# Patient Record
Sex: Female | Born: 1955 | Race: White | Hispanic: No | Smoking: Never smoker
Health system: Southern US, Community
[De-identification: ages and names within clinical notes are randomized; demographics above are authoritative.]

## PROBLEM LIST (undated history)

## (undated) DIAGNOSIS — F32A Depression, unspecified: Secondary | ICD-10-CM

## (undated) DIAGNOSIS — M1990 Unspecified osteoarthritis, unspecified site: Secondary | ICD-10-CM

## (undated) DIAGNOSIS — E119 Type 2 diabetes mellitus without complications: Secondary | ICD-10-CM

## (undated) DIAGNOSIS — T8859XA Other complications of anesthesia, initial encounter: Secondary | ICD-10-CM

## (undated) DIAGNOSIS — T4145XA Adverse effect of unspecified anesthetic, initial encounter: Secondary | ICD-10-CM

## (undated) DIAGNOSIS — R112 Nausea with vomiting, unspecified: Secondary | ICD-10-CM

## (undated) DIAGNOSIS — F419 Anxiety disorder, unspecified: Secondary | ICD-10-CM

## (undated) DIAGNOSIS — F329 Major depressive disorder, single episode, unspecified: Secondary | ICD-10-CM

## (undated) DIAGNOSIS — E039 Hypothyroidism, unspecified: Secondary | ICD-10-CM

## (undated) DIAGNOSIS — I10 Essential (primary) hypertension: Secondary | ICD-10-CM

## (undated) HISTORY — PX: EYE SURGERY: SHX253

## (undated) HISTORY — PX: OTHER SURGICAL HISTORY: SHX169

---
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
## 2003-08-02 ENCOUNTER — Other Ambulatory Visit: Admission: RE | Admit: 2003-08-02 | Discharge: 2003-08-02 | Payer: Self-pay | Admitting: Obstetrics and Gynecology

## 2004-09-26 ENCOUNTER — Ambulatory Visit (HOSPITAL_COMMUNITY): Admission: RE | Admit: 2004-09-26 | Discharge: 2004-09-26 | Payer: Self-pay | Admitting: Orthopedic Surgery

## 2005-07-04 ENCOUNTER — Other Ambulatory Visit: Admission: RE | Admit: 2005-07-04 | Discharge: 2005-07-04 | Payer: Self-pay | Admitting: Obstetrics and Gynecology

## 2006-05-23 ENCOUNTER — Ambulatory Visit (HOSPITAL_COMMUNITY): Admission: RE | Admit: 2006-05-23 | Discharge: 2006-05-24 | Payer: Self-pay | Admitting: Orthopedic Surgery

## 2006-11-05 ENCOUNTER — Encounter: Admission: RE | Admit: 2006-11-05 | Discharge: 2006-11-05 | Payer: Self-pay | Admitting: Surgery

## 2006-11-13 ENCOUNTER — Ambulatory Visit (HOSPITAL_COMMUNITY): Admission: RE | Admit: 2006-11-13 | Discharge: 2006-11-13 | Payer: Self-pay | Admitting: Surgery

## 2006-11-14 ENCOUNTER — Ambulatory Visit (HOSPITAL_COMMUNITY): Admission: RE | Admit: 2006-11-14 | Discharge: 2006-11-14 | Payer: Self-pay | Admitting: Surgery

## 2007-01-08 ENCOUNTER — Ambulatory Visit (HOSPITAL_COMMUNITY): Admission: RE | Admit: 2007-01-08 | Discharge: 2007-01-08 | Payer: Self-pay | Admitting: Surgery

## 2007-05-22 HISTORY — PX: LAPAROSCOPIC GASTRIC BANDING: SHX1100

## 2007-06-11 ENCOUNTER — Encounter: Admission: RE | Admit: 2007-06-11 | Discharge: 2007-09-09 | Payer: Self-pay | Admitting: Surgery

## 2007-06-30 ENCOUNTER — Inpatient Hospital Stay (HOSPITAL_COMMUNITY): Admission: RE | Admit: 2007-06-30 | Discharge: 2007-07-01 | Payer: Self-pay | Admitting: Surgery

## 2007-08-28 ENCOUNTER — Encounter: Admission: RE | Admit: 2007-08-28 | Discharge: 2007-08-28 | Payer: Self-pay | Admitting: Surgery

## 2007-11-27 ENCOUNTER — Encounter: Admission: RE | Admit: 2007-11-27 | Discharge: 2007-11-27 | Payer: Self-pay | Admitting: Surgery

## 2009-04-26 ENCOUNTER — Encounter: Admission: RE | Admit: 2009-04-26 | Discharge: 2009-05-18 | Payer: Self-pay | Admitting: Surgery

## 2010-10-03 NOTE — Op Note (Signed)
Ann Myers, Ann Myers                 ACCOUNT NO.:  [id number]   MEDICAL RECORD NO.:  [id number]          PATIENT TYPE:  INP   LOCATION:  1240                         FACILITY:  [facility]   PHYSICIAN:  Thornton Park. Daphine Deutscher, MD  DATE OF BIRTH:  06/21/1955   DATE OF PROCEDURE:  06/30/2007  DATE OF DISCHARGE:                               OPERATIVE REPORT   CCS#:  [id number].   PREOPERATIVE DIAGNOSES:  1. Morbid obesity body mass index 49.  2. Diabetes.  3. Hypertension.   POSTOPERATIVE DIAGNOSES:  Not given.   PROCEDURE:  Laparoscopic lap band (Allergan) APL system.   SURGEON:  Dr. Luretha Murphy.   ASSISTANT:  Dr. Ovidio Kin.   ANESTHESIA:  General endotracheal.   COMPLICATIONS:  Left upper quadrant trocar  placement went into the  abdomen and across the diaphragm into chest producing temporary left  pneumothorax which was treated with the Veress needle.   DESCRIPTION OF PROCEDURE:  Ann Myers is taken to room #1 on Monday,  June 30, 2007 and given general anesthesia.  The abdomen is was  prepped with Techni-Care and draped sterilely.  After a time-out down  and following her latex allergy protocol, I went below the left costal  region and made above and centimeter and a half incision for placement  of the OptiVu trocar.  With a zero degree trocar in place, I first put  my finger in and dissected down through the fat to the fascia and it was  well below the rib.  At that point, I went ahead and introduced the  OptiVu and watched to go through to the muscle layers and then into what  appeared to be the peritoneum and at that point I was not getting any  insufflation into the abdomen that  I could tell.  I moved around, kind  of gently went in gently went in and felt that I was sort of on the cusp  and could see of muscle layers and felt like I was in but was not able  to blow away the fat to get my camera into the pneumoperitoneum.  Percussion of the abdomen revealed there to be a  pneumoperitoneum and on  pulling back and trying to go forward I felt like there was an area to  the right and I looked in there and I could see up into the chest and  there was likely a pneumothorax from insufflation in that region as  well.  At that point, with the pneumoperitoneum I went ahead and entered  the abdomen through the right upper quadrant and could see where the  trocar would kind of come in and then become angulated as it went  beneath the rib.  The patient was hemodynamically stable but  subsequently was noted to have decreased breath sounds.  I then went  into a prepped area prepped with Techni-Care in the intercostal space  beneath the left breast and using a laparoscope into the abdomen where I  could watch myself, I inserted a Veress needle and once it popped in, I  could hear  air escaping with ventilation and I elected to go ahead and  leave that in place and put it to suction throughout the case.   The patient again remained hemodynamically stable and with that in place  I went ahead and placed the remainder of my trocars including the 5 mm  upper midline, the 10 across below and retracted the liver.  I dissected  on the left side for the band site and then went over on the right side  and found the right crus and dissected the spot for the band passer.  I  had a 15 in the upper position and the 11 below and introduced the band  after passing the passer from the inferior port up which it came out  easily on the other side.  The band was then threaded through the band  passer, pulled around, it was snapped in place.  Prior to snapping it,  we passed the calibration tube and there was plenty of room.  I chose an  APL band because of the fatty mass along her lesser curvature.  It was  then pulled down by Dr. Ezzard Standing while I plicated it with three sutures  using free needles and tie knots.  Good band position was felt to be in  place, it was nice and secure. There is no  bleeding.  There were no  other areas of question in the abdomen. We went ahead and observed the  left upper quadrant and  I could see where the diaphragm had fluttered  when I had initially sucked all the air out and we see where it was and  it appeared to be still suctioned. We then deflated the abdomen  completely and then I with the suction withdrew the Veress needle.   The wounds were all closed with 4-0 Vicryl, Benzoin and Steri-Strips. A  chest x-ray was taken at the end of the case and there was no evidence  of pneumothorax. The patient tolerated the procedure well and was taken  to the recovery room. She will be monitored in step down and followup  chest x-rays obtained.      Thornton Park Daphine Deutscher, MD  Electronically Signed     MBM/MEDQ  D:  06/30/2007  T:  07/01/2007  Job:  [id number]   cc:   Minna Antis, MD  [place], [place]

## 2011-02-09 LAB — CBC
HCT: 36.5
Hemoglobin: 12.7
MCHC: 34.8
MCV: 88
Platelets: 351
RBC: 4.15
WBC: 10.7 — ABNORMAL HIGH

## 2011-02-09 LAB — BASIC METABOLIC PANEL
BUN: 20
Calcium: 9.3
Chloride: 97
Creatinine, Ser: 0.79
GFR calc Af Amer: >60
GFR calc non Af Amer: >60

## 2011-02-09 LAB — URINALYSIS, ROUTINE W REFLEX MICROSCOPIC
Glucose, UA: NEGATIVE
Leukocytes, UA: NEGATIVE
Specific Gravity, Urine: 1.03
Urobilinogen, UA: 0.2

## 2011-02-09 LAB — DIFFERENTIAL
Eosinophils Relative: 0
Monocytes Relative: 8
Neutro Abs: 8.3 — ABNORMAL HIGH
Neutrophils Relative %: 78 — ABNORMAL HIGH

## 2011-02-09 LAB — URINE MICROSCOPIC-ADD ON

## 2011-02-09 LAB — PREGNANCY, URINE: Preg Test, Ur: NEGATIVE

## 2012-05-21 HISTORY — PX: HERNIA REPAIR: SHX51

## 2013-05-21 HISTORY — PX: CHOLECYSTECTOMY: SHX55

## 2018-05-21 HISTORY — PX: DILATION AND CURETTAGE OF UTERUS: SHX78

## 2019-01-20 HISTORY — PX: OTHER SURGICAL HISTORY: SHX169

## 2019-07-13 NOTE — H&P (Signed)
TOTAL HIP ADMISSION H&P  Patient is admitted for right total hip arthroplasty, anterior approach.  Subjective:  Chief Complaint:   Right hip primary OA / pain  HPI: Ann Myers, 64 y.o. female, has a history of pain and functional disability in the right hip(s) due to arthritis and patient has failed non-surgical conservative treatments for greater than 12 weeks to include NSAID's and/or analgesics, use of assistive devices and activity modification.  Onset of symptoms was gradual starting <1 year ago with rapidlly worsening course since that time.The patient noted no past surgery on the right hip(s).  Patient currently rates pain in the right hip at 10 out of 10 with activity. Patient has worsening of pain with activity and weight bearing, trendelenberg gait, pain that interfers with activities of daily living and pain with passive range of motion. Patient has evidence of periarticular osteophytes and joint space narrowing by imaging studies. This condition presents safety issues increasing the risk of falls.  There is no current active infection.  Risks, benefits and expectations were discussed with the patient.  Risks including but not limited to the risk of anesthesia, blood clots, nerve damage, blood vessel damage, failure of the prosthesis, infection and up to and including death.  Patient understand the risks, benefits and expectations and wishes to proceed with surgery.   PCP: Renaldo Reel, DO  D/C Plans:       Home (same day)  Post-op Meds:       No Rx given   Tranexamic Acid:      To be given - IV   Decadron:      Is to be given  FYI:     ASA  Oxycodone (used previously)  DME:   Pt already has equipment   PT:   HEP  Pharmacy: [facility]     Past Medical History:  Diagnosis Date  . Anxiety   . Arthritis   . Complication of anesthesia   . Depression   . Diabetes mellitus without complication ([place])   . Hypertension   . Hypothyroidism   . PONV  (postoperative nausea and vomiting)     Past Surgical History:  Procedure Laterality Date  . CESAREAN SECTION  1988  . CHOLECYSTECTOMY  2015  . DILATION AND CURETTAGE OF UTERUS  2020   Dr. Ouida Sills at [facility]  and at 64 years old-for irregular bleeding  . EYE SURGERY     brow lift surgery  . ganglion cyst of foot  01/2019   left foot  . HERNIA REPAIR  2014   incisional hernia repair-and did tummy tuck at that time  . LAPAROSCOPIC GASTRIC BANDING  2009  . left shoulder surgery     shaving of left shoulder-arthritis    No current facility-administered medications for this encounter.   Current Outpatient Medications  Medication Sig Dispense Refill Last Dose  . acetaminophen (TYLENOL) 500 MG tablet Take 1,000 mg by mouth every 6 (six) hours as needed for mild pain or moderate pain.     . APPLE CIDER VINEGAR PO Take 1,000 mg by mouth daily.     Marland Kitchen dicyclomine (BENTYL) 10 MG capsule Take 10 mg by mouth 3 (three) times daily as needed for diarrhea or loose stools.     . DULoxetine (CYMBALTA) 60 MG capsule Take 60 mg by mouth daily.     Marland Kitchen ibuprofen (ADVIL) 200 MG tablet Take 400 mg by mouth every 6 (six) hours as needed for mild pain or  moderate pain.     . INVOKANA 100 MG TABS tablet Take 100 mg by mouth daily.     Marland Kitchen levothyroxine (SYNTHROID) 88 MCG tablet Take 88 mcg by mouth daily.     Marland Kitchen losartan-hydrochlorothiazide (HYZAAR) 50-12.5 MG tablet Take 1 tablet by mouth daily.     . Multiple Vitamin (MULTI-VITAMIN) tablet Take 1 tablet by mouth daily. Woman 50+     . OZEMPIC, 0.25 OR 0.5 MG/DOSE, 2 MG/1.5ML SOPN Inject 0.5 mg into the skin once a week. Friday     . Polyethyl Glycol-Propyl Glycol (SYSTANE FREE OP) Place 1 drop into both eyes daily as needed (Dry eye).     Marland Kitchen tolterodine (DETROL LA) 4 MG 24 hr capsule Take 4 mg by mouth daily.     . Triamcinolone Acetonide (NASACORT AQ NA) Place 1 spray into the nose daily as needed (congestion).      Allergies  Allergen Reactions  .  Benactyzine Hives  . Other Hives  . Povidone Iodine Hives  . Short Ragweed Pollen Ext Hives  . Adhesive [Tape] Other (See Comments)    Steri-strips/Welp  . Latex Other (See Comments)    Dry Blister     Social History   Tobacco Use  . Smoking status: Never Smoker  . Smokeless tobacco: Never Used  Substance Use Topics  . Alcohol use: Yes    Comment: occassionally       Review of Systems  Constitutional: Negative.   HENT: Negative.   Eyes: Negative.   Respiratory: Negative.   Cardiovascular: Negative.   Gastrointestinal: Negative.   Genitourinary: Negative.   Musculoskeletal: Positive for joint pain.  Skin: Negative.   Neurological: Positive for headaches.  Endo/Heme/Allergies: Negative.   Psychiatric/Behavioral: Positive for depression. The patient is nervous/anxious.      Objective:  Physical Exam  Constitutional: She is oriented to person, place, and time. She appears well-developed.  HENT:  Head: Normocephalic.  Eyes: Pupils are equal, round, and reactive to light.  Neck: No JVD present. No tracheal deviation present. No thyromegaly present.  Cardiovascular: Normal rate, regular rhythm and intact distal pulses.  Respiratory: Effort normal and breath sounds normal. No respiratory distress. She has no wheezes.  GI: Soft. There is no abdominal tenderness. There is no guarding.  Musculoskeletal:     Cervical back: Neck supple.     Right hip: Tenderness and bony tenderness present. No swelling, deformity or lacerations. Decreased range of motion. Decreased strength.  Lymphadenopathy:    She has no cervical adenopathy.  Neurological: She is alert and oriented to person, place, and time.  Skin: Skin is warm and dry.  Psychiatric: She has a normal mood and affect.      Imaging Review Plain radiographs demonstrate severe degenerative joint disease of the right hip. The bone quality appears to be good for age and reported activity  level.      Assessment/Plan:  End stage arthritis, right hip  The patient history, physical examination, clinical judgement of the provider and imaging studies are consistent with end stage degenerative joint disease of the right hip and total hip arthroplasty is deemed medically necessary. The treatment options including medical management, injection therapy, arthroscopy and arthroplasty were discussed at length. The risks and benefits of total hip arthroplasty were presented and reviewed. The risks due to aseptic loosening, infection, stiffness, dislocation/subluxation,  thromboembolic complications and other imponderables were discussed.  The patient acknowledged the explanation, agreed to proceed with the plan and consent was signed. Patient is  being admitted for inpatient treatment for surgery, pain control, PT, OT, prophylactic antibiotics, VTE prophylaxis, progressive ambulation and ADL's and discharge planning.The patient is planning to be discharged home.      Anastasio Auerbach Jilliann Subramanian   PA-C  07/27/2019, 9:13 PM

## 2019-07-20 NOTE — Patient Instructions (Addendum)
DUE TO COVID-19 ONLY ONE VISITOR IS ALLOWED TO COME WITH YOU AND STAY IN THE WAITING ROOM ONLY DURING PRE OP AND PROCEDURE DAY OF SURGERY. THE 1 VISITOR MAY VISIT WITH YOU AFTER SURGERY IN YOUR PRIVATE ROOM DURING VISITING HOURS ONLY!  YOU NEED TO HAVE A COVID 19 TEST ON__Friday 03/05/2021_____ @__10 :45 am_____, THIS TEST MUST BE DONE BEFORE SURGERY, COME  [address], [place] , .  [facility]) ONCE YOUR COVID TEST IS COMPLETED, PLEASE BEGIN THE QUARANTINE INSTRUCTIONS AS OUTLINED IN YOUR HANDOUT.                ROSINA CRESSLER     Your procedure is scheduled on: Tuesday 07/28/2019   Report to [facility] Main  Entrance    Report to admitting at  0900  AM     Call this number if you have problems the morning of surgery [phone]        How to Manage Your Diabetes  Before and After Surgery  Why is it important to control my blood sugar before and after surgery? . Improving blood sugar levels before and after surgery helps healing and can limit problems. . A way of improving blood sugar control is eating a healthy diet by: o  Eating less sugar and carbohydrates o  Increasing activity/exercise o  Talking with your doctor about reaching your blood sugar goals . High blood sugars (greater than 180 mg/dL) can raise your risk of infections and slow your recovery, so you will need to focus on controlling your diabetes during the weeks before surgery. . Make sure that the doctor who takes care of your diabetes knows about your planned surgery including the date and location.  How do I manage my blood sugar before surgery? . Check your blood sugar at least 4 times a day, starting 2 days before surgery, to make sure that the level is not too high or low. o Check your blood sugar the morning of your surgery when you wake up and every 2 hours until you get to the Short Stay unit. . If your blood sugar is less than 70 mg/dL, you will need to treat for low  blood sugar: o Do not take insulin. o Treat a low blood sugar (less than 70 mg/dL) with  cup of clear juice (cranberry or apple), 4 glucose tablets, OR glucose gel. o Recheck blood sugar in 15 minutes after treatment (to make sure it is greater than 70 mg/dL). If your blood sugar is not greater than 70 mg/dL on recheck, call [phone] for further instructions. . Report your blood sugar to the short stay nurse when you get to Short Stay.  . If you are admitted to the hospital after surgery: o Your blood sugar will be checked by the staff and you will probably be given insulin after surgery (instead of oral diabetes medicines) to make sure you have good blood sugar levels. o The goal for blood sugar control after surgery is 80-180 mg/dL.   WHAT DO I DO ABOUT MY DIABETES MEDICATION?        The day before surgery, DO NOT TAKE INVOKANA!  [phone] Do not take oral diabetes medicines (pills) the morning of surgery.     Remember: Do not eat food  :After Midnight.    NO SOLID FOOD AFTER MIDNIGHT THE NIGHT PRIOR TO SURGERY. NOTHING BY MOUTH EXCEPT CLEAR LIQUIDS UNTIL  0830 am .    PLEASE FINISH  Gatorade  G 2  DRINK PER SURGEON ORDER  WHICH NEEDS TO BE COMPLETED AT  0830 am.   CLEAR LIQUID DIET   Foods Allowed                                                                     Foods Excluded  Coffee and tea, regular and decaf                             liquids that you cannot  Plain Jell-O any favor except red or purple                                           see through such as: Fruit ices (not with fruit pulp)                                     milk, soups, orange juice  Iced Popsicles                                    All solid food Carbonated beverages, regular and diet                                    Cranberry, grape and apple juices Sports drinks like Gatorade Lightly seasoned clear broth or consume(fat free) Sugar, honey syrup  Sample Menu Breakfast                                 Lunch                                     Supper Cranberry juice                    Beef broth                            Chicken broth Jell-O                                     Grape juice                           Apple juice Coffee or tea                        Jell-O                                      Popsicle  Coffee or tea                        Coffee or tea  _____________________________________________________________________     BRUSH YOUR TEETH MORNING OF SURGERY AND RINSE YOUR MOUTH OUT, NO CHEWING GUM CANDY OR MINTS.     Take these medicines the morning of surgery with A SIP OF WATER: Duloxetine (Cymbalta), Levothyroxine (Synthroid)   DO NOT TAKE ANY DIABETIC MEDICATIONS DAY OF YOUR SURGERY!                                You may not have any metal on your body including hair pins and              piercings  Do not wear jewelry, make-up, lotions, powders or perfumes, deodorant             Do not wear nail polish on your fingernails.  Do not shave  48 hours prior to surgery.                 Do not bring valuables to the hospital. [facility] IS NOT             RESPONSIBLE   FOR VALUABLES.  Contacts, dentures or bridgework may not be worn into surgery.  Leave suitcase in the car. After surgery it may be brought to your room.                   Please read over the following fact sheets you were given: _____________________________________________________________________             [facility] - Preparing for Surgery Before surgery, you can play an important role.  Because skin is not sterile, your skin needs to be as free of germs as possible.  You can reduce the number of germs on your skin by washing with CHG (chlorahexidine gluconate) soap before surgery.  CHG is an antiseptic cleaner which kills germs and bonds with the skin to continue killing germs even after washing. Please DO NOT use if you have an allergy  to CHG or antibacterial soaps.  If your skin becomes reddened/irritated stop using the CHG and inform your nurse when you arrive at Short Stay. Do not shave (including legs and underarms) for at least 48 hours prior to the first CHG shower.  You may shave your face/neck. Please follow these instructions carefully:  1.  Shower with CHG Soap the night before surgery and the  morning of Surgery.  2.  If you choose to wash your hair, wash your hair first as usual with your  normal  shampoo.  3.  After you shampoo, rinse your hair and body thoroughly to remove the  shampoo.                           4.  Use CHG as you would any other liquid soap.  You can apply chg directly  to the skin and wash                       Gently with a scrungie or clean washcloth.  5.  Apply the CHG Soap to your body ONLY FROM THE NECK DOWN.   Do not use on face/ open  Wound or open sores. Avoid contact with eyes, ears mouth and genitals (private parts).                       Wash face,  Genitals (private parts) with your normal soap.             6.  Wash thoroughly, paying special attention to the area where your surgery  will be performed.  7.  Thoroughly rinse your body with warm water from the neck down.  8.  DO NOT shower/wash with your normal soap after using and rinsing off  the CHG Soap.                9.  Pat yourself dry with a clean towel.            10.  Wear clean pajamas.            11.  Place clean sheets on your bed the night of your first shower and do not  sleep with pets. Day of Surgery : Do not apply any lotions/deodorants the morning of surgery.  Please wear clean clothes to the hospital/surgery center.  FAILURE TO FOLLOW THESE INSTRUCTIONS MAY RESULT IN THE CANCELLATION OF YOUR SURGERY PATIENT SIGNATURE_________________________________  NURSE SIGNATURE__________________________________  ________________________________________________________________________   Adam Phenix  An incentive spirometer is a tool that can help keep your lungs clear and active. This tool measures how well you are filling your lungs with each breath. Taking long deep breaths may help reverse or decrease the chance of developing breathing (pulmonary) problems (especially infection) following:  A long period of time when you are unable to move or be active. BEFORE THE PROCEDURE   If the spirometer includes an indicator to show your best effort, your nurse or respiratory therapist will set it to a desired goal.  If possible, sit up straight or lean slightly forward. Try not to slouch.  Hold the incentive spirometer in an upright position. INSTRUCTIONS FOR USE  1. Sit on the edge of your bed if possible, or sit up as far as you can in bed or on a chair. 2. Hold the incentive spirometer in an upright position. 3. Breathe out normally. 4. Place the mouthpiece in your mouth and seal your lips tightly around it. 5. Breathe in slowly and as deeply as possible, raising the piston or the ball toward the top of the column. 6. Hold your breath for 3-5 seconds or for as long as possible. Allow the piston or ball to fall to the bottom of the column. 7. Remove the mouthpiece from your mouth and breathe out normally. 8. Rest for a few seconds and repeat Steps 1 through 7 at least 10 times every 1-2 hours when you are awake. Take your time and take a few normal breaths between deep breaths. 9. The spirometer may include an indicator to show your best effort. Use the indicator as a goal to work toward during each repetition. 10. After each set of 10 deep breaths, practice coughing to be sure your lungs are clear. If you have an incision (the cut made at the time of surgery), support your incision when coughing by placing a pillow or rolled up towels firmly against it. Once you are able to get out of bed, walk around indoors and cough well. You may stop using the incentive spirometer when  instructed by your caregiver.  RISKS AND COMPLICATIONS  Take your time so you do not  get dizzy or light-headed.  If you are in pain, you may need to take or ask for pain medication before doing incentive spirometry. It is harder to take a deep breath if you are having pain. AFTER USE  Rest and breathe slowly and easily.  It can be helpful to keep track of a log of your progress. Your caregiver can provide you with a simple table to help with this. If you are using the spirometer at home, follow these instructions: [place] IF:   You are having difficultly using the spirometer.  You have trouble using the spirometer as often as instructed.  Your pain medication is not giving enough relief while using the spirometer.  You develop fever of 100.5 F (38.1 C) or higher. SEEK IMMEDIATE MEDICAL CARE IF:   You cough up bloody sputum that had not been present before.  You develop fever of 102 F (38.9 C) or greater.  You develop worsening pain at or near the incision site. MAKE SURE YOU:   Understand these instructions.  Will watch your condition.  Will get help right away if you are not doing well or get worse. Document Released: 09/17/2006 Document Revised: 07/30/2011 Document Reviewed: 11/18/2006 Exit[employer] Patient Information 2014 ExitCare, [place].   ________________________________________________________________________  WHAT IS A BLOOD TRANSFUSION? Blood Transfusion Information  A transfusion is the replacement of blood or some of its parts. Blood is made up of multiple cells which provide different functions.  Red blood cells carry oxygen and are used for blood loss replacement.  White blood cells fight against infection.  Platelets control bleeding.  Plasma helps clot blood.  Other blood products are available for specialized needs, such as hemophilia or other clotting disorders. BEFORE THE TRANSFUSION  Who gives blood for transfusions?   Healthy  volunteers who are fully evaluated to make sure their blood is safe. This is blood bank blood. Transfusion therapy is the safest it has ever been in the practice of medicine. Before blood is taken from a donor, a complete history is taken to make sure that person has no history of diseases nor engages in risky social behavior (examples are intravenous drug use or sexual activity with multiple partners). The donor's travel history is screened to minimize risk of transmitting infections, such as malaria. The donated blood is tested for signs of infectious diseases, such as HIV and hepatitis. The blood is then tested to be sure it is compatible with you in order to minimize the chance of a transfusion reaction. If you or a relative donates blood, this is often done in anticipation of surgery and is not appropriate for emergency situations. It takes many days to process the donated blood. RISKS AND COMPLICATIONS Although transfusion therapy is very safe and saves many lives, the main dangers of transfusion include:   Getting an infectious disease.  Developing a transfusion reaction. This is an allergic reaction to something in the blood you were given. Every precaution is taken to prevent this. The decision to have a blood transfusion has been considered carefully by your caregiver before blood is given. Blood is not given unless the benefits outweigh the risks. AFTER THE TRANSFUSION  Right after receiving a blood transfusion, you will usually feel much better and more energetic. This is especially true if your red blood cells have gotten low (anemic). The transfusion raises the level of the red blood cells which carry oxygen, and this usually causes an energy increase.  The nurse administering the transfusion will **Note De-Identified Weidmann Obfuscation** monitor you carefully for complications. HOME CARE INSTRUCTIONS  No special instructions are needed after a transfusion. You may find your energy is better. Speak with your caregiver about any  limitations on activity for underlying diseases you may have. SEEK MEDICAL CARE IF:   Your condition is not improving after your transfusion.  You develop redness or irritation at the intravenous (IV) site. SEEK IMMEDIATE MEDICAL CARE IF:  Any of the following symptoms occur over the next 12 hours:  Shaking chills.  You have a temperature by mouth above 102 F (38.9 C), not controlled by medicine.  Chest, back, or muscle pain.  People around you feel you are not acting correctly or are confused.  Shortness of breath or difficulty breathing.  Dizziness and fainting.  You get a rash or develop hives.  You have a decrease in urine output.  Your urine turns a dark color or changes to pink, red, or brown. Any of the following symptoms occur over the next 10 days:  You have a temperature by mouth above 102 F (38.9 C), not controlled by medicine.  Shortness of breath.  Weakness after normal activity.  The [place] part of the eye turns yellow (jaundice).  You have a decrease in the amount of urine or are urinating less often.  Your urine turns a dark color or changes to pink, red, or brown. Document Released: 05/04/2000 Document Revised: 07/30/2011 Document Reviewed: 12/22/2007 [facility] Patient Information 2014 [place], [place].  _______________________________________________________________________

## 2019-07-21 ENCOUNTER — Encounter (HOSPITAL_COMMUNITY): Payer: Self-pay

## 2019-07-21 ENCOUNTER — Encounter (HOSPITAL_COMMUNITY)
Admission: RE | Admit: 2019-07-21 | Discharge: 2019-07-21 | Disposition: A | Discharge status: Home / Self Care | Payer: Medicare Other | Source: Ambulatory Visit | Attending: Orthopedic Surgery | Admitting: Orthopedic Surgery

## 2019-07-21 ENCOUNTER — Other Ambulatory Visit: Payer: Self-pay

## 2019-07-21 DIAGNOSIS — E119 Type 2 diabetes mellitus without complications: Secondary | ICD-10-CM | POA: Diagnosis not present

## 2019-07-21 DIAGNOSIS — Z79899 Other long term (current) drug therapy: Secondary | ICD-10-CM | POA: Insufficient documentation

## 2019-07-21 DIAGNOSIS — M1611 Unilateral primary osteoarthritis, right hip: Secondary | ICD-10-CM | POA: Insufficient documentation

## 2019-07-21 DIAGNOSIS — Z01818 Encounter for other preprocedural examination: Secondary | ICD-10-CM | POA: Diagnosis not present

## 2019-07-21 DIAGNOSIS — Z20822 Contact with and (suspected) exposure to covid-19: Secondary | ICD-10-CM | POA: Insufficient documentation

## 2019-07-21 DIAGNOSIS — I10 Essential (primary) hypertension: Secondary | ICD-10-CM | POA: Insufficient documentation

## 2019-07-21 DIAGNOSIS — E039 Hypothyroidism, unspecified: Secondary | ICD-10-CM | POA: Diagnosis not present

## 2019-07-21 DIAGNOSIS — Z79890 Hormone replacement therapy (postmenopausal): Secondary | ICD-10-CM | POA: Insufficient documentation

## 2019-07-21 HISTORY — DX: Type 2 diabetes mellitus without complications: E11.9

## 2019-07-21 HISTORY — DX: Essential (primary) hypertension: I10

## 2019-07-21 HISTORY — DX: Other complications of anesthesia, initial encounter: T88.59XA

## 2019-07-21 HISTORY — DX: Nausea with vomiting, unspecified: R11.2

## 2019-07-21 HISTORY — DX: Depression, unspecified: F32.A

## 2019-07-21 HISTORY — DX: Unspecified osteoarthritis, unspecified site: M19.90

## 2019-07-21 HISTORY — DX: Anxiety disorder, unspecified: F41.9

## 2019-07-21 HISTORY — DX: Hypothyroidism, unspecified: E03.9

## 2019-07-21 NOTE — Progress Notes (Addendum)
PCP - Dr. Minna Antis  [facility],[place]  Faxed request on 07/21/19  to get LOV, clearance, EKG and labs 06/26/2019-clearance on chart Cardiologist - n/a  Chest x-ray - n/a EKG - 06/30/2019 on chart Stress Test - n/a ECHO - n/a Cardiac Cath - n/a  Sleep Study - 2008  [facility] CPAP - no  Fasting Blood Sugar - 125 Checks Blood Sugar __1___ times a day  Blood Thinner Instructions:n/a Aspirin Instructions:n/a Last Dose:Uses Ibuprofen and stopped one week ago  Anesthesia review:   Patient denies shortness of breath, fever, cough and chest pain at PAT appointment   Patient verbalized understanding of instructions that were given to them at the PAT appointment. Patient was also instructed that they will need to review over the PAT instructions again at home before surgery.

## 2019-07-24 ENCOUNTER — Other Ambulatory Visit (HOSPITAL_COMMUNITY)
Admission: RE | Admit: 2019-07-24 | Discharge: 2019-07-24 | Disposition: A | Discharge status: Home / Self Care | Payer: Medicare Other | Source: Ambulatory Visit | Attending: Orthopedic Surgery | Admitting: Orthopedic Surgery

## 2019-07-24 ENCOUNTER — Encounter (HOSPITAL_COMMUNITY)
Admission: RE | Admit: 2019-07-24 | Discharge: 2019-07-24 | Disposition: A | Discharge status: Home / Self Care | Payer: Medicare Other | Source: Ambulatory Visit | Attending: Orthopedic Surgery | Admitting: Orthopedic Surgery

## 2019-07-24 ENCOUNTER — Other Ambulatory Visit: Payer: Self-pay

## 2019-07-24 DIAGNOSIS — M1611 Unilateral primary osteoarthritis, right hip: Secondary | ICD-10-CM | POA: Diagnosis not present

## 2019-07-24 DIAGNOSIS — Z01818 Encounter for other preprocedural examination: Secondary | ICD-10-CM | POA: Diagnosis not present

## 2019-07-24 DIAGNOSIS — Z20822 Contact with and (suspected) exposure to covid-19: Secondary | ICD-10-CM | POA: Diagnosis not present

## 2019-07-24 DIAGNOSIS — E119 Type 2 diabetes mellitus without complications: Secondary | ICD-10-CM | POA: Diagnosis not present

## 2019-07-24 LAB — BASIC METABOLIC PANEL
Anion gap: 9 (ref 5–15)
BUN: 16 mg/dL (ref 8–23)
CO2: 29 mmol/L (ref 22–32)
Calcium: 9.3 mg/dL (ref 8.9–10.3)
Chloride: 102 mmol/L (ref 98–111)
Creatinine, Ser: 0.78 mg/dL (ref 0.44–1.00)
GFR calc Af Amer: >60 mL/min (ref >60)
GFR calc non Af Amer: >60 mL/min (ref >60)
Glucose, Bld: 87 mg/dL (ref 70–99)
Potassium: 3.6 mmol/L (ref 3.5–5.1)
Sodium: 140 mmol/L (ref 135–145)

## 2019-07-24 LAB — CBC
HCT: 48.1 % — ABNORMAL HIGH (ref 36.0–46.0)
Hemoglobin: 15.9 g/dL — ABNORMAL HIGH (ref 12.0–15.0)
MCH: 30.5 pg (ref 26.0–34.0)
MCHC: 33.1 g/dL (ref 30.0–36.0)
MCV: 92.3 fL (ref 80.0–100.0)
Platelets: 336 10*3/uL (ref 150–400)
RBC: 5.21 MIL/uL — ABNORMAL HIGH (ref 3.87–5.11)
RDW: 13 % (ref 11.5–15.5)
WBC: 7.3 10*3/uL (ref 4.0–10.5)
nRBC: 0 % (ref 0.0–0.2)

## 2019-07-24 LAB — ABO/RH: ABO/RH(D): A POS

## 2019-07-24 LAB — SARS CORONAVIRUS 2 (TAT 6-24 HRS): SARS Coronavirus 2: NEGATIVE

## 2019-07-24 LAB — SURGICAL PCR SCREEN
MRSA, PCR: NEGATIVE
Staphylococcus aureus: NEGATIVE

## 2019-07-24 LAB — GLUCOSE, CAPILLARY: Glucose-Capillary: 101 mg/dL — ABNORMAL HIGH (ref 70–99)

## 2019-07-25 LAB — HEMOGLOBIN A1C
Hgb A1c MFr Bld: 6.2 % — ABNORMAL HIGH (ref 4.8–5.6)
Mean Plasma Glucose: 131.24 mg/dL

## 2019-07-28 ENCOUNTER — Other Ambulatory Visit: Payer: Self-pay

## 2019-07-28 ENCOUNTER — Ambulatory Visit (HOSPITAL_COMMUNITY): Payer: Medicare Other | Admitting: Anesthesiology

## 2019-07-28 ENCOUNTER — Observation Stay (HOSPITAL_COMMUNITY)
Admission: RE | Admit: 2019-07-28 | Discharge: 2019-07-29 | Disposition: A | Discharge status: Home / Self Care | Payer: Medicare Other | Attending: Orthopedic Surgery | Admitting: Orthopedic Surgery

## 2019-07-28 ENCOUNTER — Ambulatory Visit (HOSPITAL_COMMUNITY): Payer: Medicare Other | Admitting: Physician Assistant

## 2019-07-28 ENCOUNTER — Ambulatory Visit (HOSPITAL_COMMUNITY): Payer: Medicare Other

## 2019-07-28 ENCOUNTER — Encounter (HOSPITAL_COMMUNITY): Payer: Self-pay | Admitting: Orthopedic Surgery

## 2019-07-28 ENCOUNTER — Encounter (HOSPITAL_COMMUNITY)
Admission: RE | Disposition: A | Discharge status: Home / Self Care | Payer: Self-pay | Source: Home / Self Care | Attending: Orthopedic Surgery

## 2019-07-28 DIAGNOSIS — Z7984 Long term (current) use of oral hypoglycemic drugs: Secondary | ICD-10-CM | POA: Insufficient documentation

## 2019-07-28 DIAGNOSIS — Z888 Allergy status to other drugs, medicaments and biological substances status: Secondary | ICD-10-CM | POA: Diagnosis not present

## 2019-07-28 DIAGNOSIS — F419 Anxiety disorder, unspecified: Secondary | ICD-10-CM | POA: Diagnosis not present

## 2019-07-28 DIAGNOSIS — I10 Essential (primary) hypertension: Secondary | ICD-10-CM | POA: Diagnosis not present

## 2019-07-28 DIAGNOSIS — Z6838 Body mass index (BMI) 38.0-38.9, adult: Secondary | ICD-10-CM | POA: Insufficient documentation

## 2019-07-28 DIAGNOSIS — M1611 Unilateral primary osteoarthritis, right hip: Principal | ICD-10-CM | POA: Insufficient documentation

## 2019-07-28 DIAGNOSIS — Z79890 Hormone replacement therapy (postmenopausal): Secondary | ICD-10-CM | POA: Insufficient documentation

## 2019-07-28 DIAGNOSIS — F329 Major depressive disorder, single episode, unspecified: Secondary | ICD-10-CM | POA: Insufficient documentation

## 2019-07-28 DIAGNOSIS — Z91040 Latex allergy status: Secondary | ICD-10-CM | POA: Insufficient documentation

## 2019-07-28 DIAGNOSIS — E119 Type 2 diabetes mellitus without complications: Secondary | ICD-10-CM | POA: Insufficient documentation

## 2019-07-28 DIAGNOSIS — Z9884 Bariatric surgery status: Secondary | ICD-10-CM | POA: Insufficient documentation

## 2019-07-28 DIAGNOSIS — M25751 Osteophyte, right hip: Secondary | ICD-10-CM | POA: Insufficient documentation

## 2019-07-28 DIAGNOSIS — Z96641 Presence of right artificial hip joint: Secondary | ICD-10-CM

## 2019-07-28 DIAGNOSIS — Z96649 Presence of unspecified artificial hip joint: Secondary | ICD-10-CM

## 2019-07-28 DIAGNOSIS — Z419 Encounter for procedure for purposes other than remedying health state, unspecified: Secondary | ICD-10-CM

## 2019-07-28 DIAGNOSIS — E039 Hypothyroidism, unspecified: Secondary | ICD-10-CM | POA: Diagnosis not present

## 2019-07-28 DIAGNOSIS — E669 Obesity, unspecified: Secondary | ICD-10-CM | POA: Insufficient documentation

## 2019-07-28 DIAGNOSIS — Z79899 Other long term (current) drug therapy: Secondary | ICD-10-CM | POA: Diagnosis not present

## 2019-07-28 HISTORY — PX: TOTAL HIP ARTHROPLASTY: SHX124

## 2019-07-28 LAB — GLUCOSE, CAPILLARY
Glucose-Capillary: 118 mg/dL — ABNORMAL HIGH (ref 70–99)
Glucose-Capillary: 133 mg/dL — ABNORMAL HIGH (ref 70–99)
Glucose-Capillary: 204 mg/dL — ABNORMAL HIGH (ref 70–99)
Glucose-Capillary: 175 mg/dL — ABNORMAL HIGH (ref 70–99)

## 2019-07-28 LAB — TYPE AND SCREEN
ABO/RH(D): A POS
Antibody Screen: NEGATIVE

## 2019-07-28 SURGERY — ARTHROPLASTY, HIP, TOTAL, ANTERIOR APPROACH
Anesthesia: Spinal | Site: Hip | Laterality: Right

## 2019-07-28 MED ORDER — ONDANSETRON HCL 4 MG/2ML IJ SOLN: 4.0000 mg | Freq: Once | INTRAMUSCULAR | Status: DC | PRN

## 2019-07-28 MED ORDER — MAGNESIUM CITRATE PO SOLN: 1.0000 | Freq: Once | ORAL | Status: DC | PRN

## 2019-07-28 MED ORDER — DEXAMETHASONE SODIUM PHOSPHATE 10 MG/ML IJ SOLN
INTRAMUSCULAR | Status: DC | PRN
  Administered 2019-07-28: 4 mg via INTRAVENOUS

## 2019-07-28 MED ORDER — ALUM & MAG HYDROXIDE-SIMETH 200-200-20 MG/5ML PO SUSP: 15.0000 mL | ORAL | Status: DC | PRN

## 2019-07-28 MED ORDER — LACTATED RINGERS IV BOLUS: 250.0000 mL | Freq: Once | INTRAVENOUS | Status: DC

## 2019-07-28 MED ORDER — LACTATED RINGERS IV BOLUS
500.0000 mL | Freq: Once | INTRAVENOUS | Status: AC
  Administered 2019-07-28: 500 mL via INTRAVENOUS

## 2019-07-28 MED ORDER — ONDANSETRON HCL 4 MG/2ML IJ SOLN
INTRAMUSCULAR | Status: DC | PRN
  Administered 2019-07-28: 4 mg via INTRAVENOUS

## 2019-07-28 MED ORDER — METOCLOPRAMIDE HCL 5 MG/ML IJ SOLN: 5.0000 mg | Freq: Three times a day (TID) | INTRAMUSCULAR | Status: DC | PRN

## 2019-07-28 MED ORDER — PROPOFOL 10 MG/ML IV BOLUS
INTRAVENOUS | Status: AC
  Filled 2019-07-28: qty 20

## 2019-07-28 MED ORDER — HYDROCHLOROTHIAZIDE 12.5 MG PO CAPS
12.5000 mg | ORAL_CAPSULE | Freq: Every day | ORAL | Status: DC
  Administered 2019-07-29: 12.5 mg via ORAL
  Filled 2019-07-28: qty 1

## 2019-07-28 MED ORDER — INSULIN ASPART 100 UNIT/ML ~~LOC~~ SOLN: 0.0000 [IU] | Freq: Every day | SUBCUTANEOUS | Status: DC

## 2019-07-28 MED ORDER — DEXAMETHASONE SODIUM PHOSPHATE 10 MG/ML IJ SOLN: 10.0000 mg | Freq: Once | INTRAMUSCULAR | Status: DC

## 2019-07-28 MED ORDER — ACETAMINOPHEN 500 MG PO TABS
1000.0000 mg | ORAL_TABLET | Freq: Four times a day (QID) | ORAL | Status: DC
  Administered 2019-07-28 – 2019-07-29 (×3): 1000 mg via ORAL
  Filled 2019-07-28 (×3): qty 2

## 2019-07-28 MED ORDER — CELECOXIB 200 MG PO CAPS
200.0000 mg | ORAL_CAPSULE | Freq: Two times a day (BID) | ORAL | Status: DC
  Administered 2019-07-28 – 2019-07-29 (×2): 200 mg via ORAL
  Filled 2019-07-28 (×2): qty 1

## 2019-07-28 MED ORDER — PROPOFOL 500 MG/50ML IV EMUL
INTRAVENOUS | Status: DC | PRN
  Administered 2019-07-28: 100 ug/kg/min via INTRAVENOUS

## 2019-07-28 MED ORDER — LOSARTAN POTASSIUM 50 MG PO TABS
50.0000 mg | ORAL_TABLET | Freq: Every day | ORAL | Status: DC
  Administered 2019-07-29: 50 mg via ORAL
  Filled 2019-07-28: qty 1

## 2019-07-28 MED ORDER — CHLORHEXIDINE GLUCONATE 4 % EX LIQD: 60.0000 mL | Freq: Once | CUTANEOUS | Status: DC

## 2019-07-28 MED ORDER — ONDANSETRON HCL 4 MG PO TABS: 4.0000 mg | ORAL_TABLET | Freq: Four times a day (QID) | ORAL | Status: DC | PRN

## 2019-07-28 MED ORDER — FESOTERODINE FUMARATE ER 8 MG PO TB24
8.0000 mg | ORAL_TABLET | Freq: Every day | ORAL | Status: DC
  Administered 2019-07-28 – 2019-07-29 (×2): 8 mg via ORAL
  Filled 2019-07-28 (×2): qty 1

## 2019-07-28 MED ORDER — DICYCLOMINE HCL 10 MG PO CAPS
10.0000 mg | ORAL_CAPSULE | Freq: Three times a day (TID) | ORAL | Status: DC | PRN
  Administered 2019-07-28: 10 mg via ORAL
  Filled 2019-07-28 (×3): qty 1

## 2019-07-28 MED ORDER — ACETAMINOPHEN 500 MG PO TABS
1000.0000 mg | ORAL_TABLET | Freq: Once | ORAL | Status: AC
  Administered 2019-07-28: 1000 mg via ORAL
  Filled 2019-07-28: qty 2

## 2019-07-28 MED ORDER — MIDAZOLAM HCL 2 MG/2ML IJ SOLN
INTRAMUSCULAR | Status: AC
  Filled 2019-07-28: qty 2

## 2019-07-28 MED ORDER — HYDROMORPHONE HCL 1 MG/ML IJ SOLN
0.5000 mg | INTRAMUSCULAR | Status: AC | PRN
  Administered 2019-07-28 (×2): 0.5 mg via INTRAVENOUS

## 2019-07-28 MED ORDER — FENTANYL CITRATE (PF) 100 MCG/2ML IJ SOLN
INTRAMUSCULAR | Status: AC
  Filled 2019-07-28: qty 2

## 2019-07-28 MED ORDER — TRANEXAMIC ACID-NACL 1000-0.7 MG/100ML-% IV SOLN
1000.0000 mg | Freq: Once | INTRAVENOUS | Status: AC
  Administered 2019-07-28: 1000 mg via INTRAVENOUS
  Filled 2019-07-28: qty 100

## 2019-07-28 MED ORDER — OXYCODONE HCL 5 MG PO TABS: 5.0000 mg | ORAL_TABLET | ORAL | Status: DC | PRN

## 2019-07-28 MED ORDER — PROPOFOL 500 MG/50ML IV EMUL
INTRAVENOUS | Status: AC
  Filled 2019-07-28: qty 50

## 2019-07-28 MED ORDER — METHOCARBAMOL 500 MG IVPB - SIMPLE MED
INTRAVENOUS | Status: AC
  Filled 2019-07-28: qty 50

## 2019-07-28 MED ORDER — HYDROMORPHONE HCL 1 MG/ML IJ SOLN
0.5000 mg | INTRAMUSCULAR | Status: DC | PRN
  Administered 2019-07-28: 1 mg via INTRAVENOUS
  Filled 2019-07-28: qty 1

## 2019-07-28 MED ORDER — LOSARTAN POTASSIUM-HCTZ 50-12.5 MG PO TABS: 1.0000 | ORAL_TABLET | Freq: Every day | ORAL | Status: DC

## 2019-07-28 MED ORDER — CANAGLIFLOZIN 100 MG PO TABS
100.0000 mg | ORAL_TABLET | Freq: Every day | ORAL | Status: DC
  Administered 2019-07-28 – 2019-07-29 (×2): 100 mg via ORAL
  Filled 2019-07-28 (×2): qty 1

## 2019-07-28 MED ORDER — MENTHOL 3 MG MT LOZG: 1.0000 | LOZENGE | OROMUCOSAL | Status: DC | PRN

## 2019-07-28 MED ORDER — DULOXETINE HCL 60 MG PO CPEP
60.0000 mg | ORAL_CAPSULE | Freq: Every day | ORAL | Status: DC
  Administered 2019-07-29: 60 mg via ORAL
  Filled 2019-07-28: qty 1

## 2019-07-28 MED ORDER — METOCLOPRAMIDE HCL 5 MG PO TABS: 5.0000 mg | ORAL_TABLET | Freq: Three times a day (TID) | ORAL | Status: DC | PRN

## 2019-07-28 MED ORDER — POLYETHYLENE GLYCOL 3350 17 G PO PACK
17.0000 g | PACK | Freq: Two times a day (BID) | ORAL | Status: DC
  Filled 2019-07-28: qty 1

## 2019-07-28 MED ORDER — MIDAZOLAM HCL 5 MG/5ML IJ SOLN
INTRAMUSCULAR | Status: DC | PRN
  Administered 2019-07-28: 2 mg via INTRAVENOUS

## 2019-07-28 MED ORDER — ASPIRIN 81 MG PO CHEW
81.0000 mg | CHEWABLE_TABLET | Freq: Two times a day (BID) | ORAL | Status: DC
  Administered 2019-07-28 – 2019-07-29 (×2): 81 mg via ORAL
  Filled 2019-07-28 (×2): qty 1

## 2019-07-28 MED ORDER — BISACODYL 10 MG RE SUPP: 10.0000 mg | Freq: Every day | RECTAL | Status: DC | PRN

## 2019-07-28 MED ORDER — OXYCODONE HCL 5 MG PO TABS
10.0000 mg | ORAL_TABLET | ORAL | Status: DC | PRN
  Administered 2019-07-28 – 2019-07-29 (×4): 10 mg via ORAL
  Filled 2019-07-28 (×4): qty 2

## 2019-07-28 MED ORDER — DOCUSATE SODIUM 100 MG PO CAPS
100.0000 mg | ORAL_CAPSULE | Freq: Two times a day (BID) | ORAL | Status: DC
  Administered 2019-07-28 – 2019-07-29 (×2): 100 mg via ORAL
  Filled 2019-07-28 (×2): qty 1

## 2019-07-28 MED ORDER — DIPHENHYDRAMINE HCL 12.5 MG/5ML PO ELIX: 12.5000 mg | ORAL_SOLUTION | ORAL | Status: DC | PRN

## 2019-07-28 MED ORDER — SODIUM CHLORIDE 0.9 % IR SOLN
Status: DC | PRN
  Administered 2019-07-28: 1000 mL

## 2019-07-28 MED ORDER — ONDANSETRON HCL 4 MG/2ML IJ SOLN: 4.0000 mg | Freq: Four times a day (QID) | INTRAMUSCULAR | Status: DC | PRN

## 2019-07-28 MED ORDER — CEFAZOLIN SODIUM-DEXTROSE 2-4 GM/100ML-% IV SOLN
2.0000 g | INTRAVENOUS | Status: AC
  Administered 2019-07-28: 2 g via INTRAVENOUS
  Filled 2019-07-28: qty 100

## 2019-07-28 MED ORDER — FENTANYL CITRATE (PF) 100 MCG/2ML IJ SOLN
25.0000 ug | INTRAMUSCULAR | Status: DC | PRN
  Administered 2019-07-28 (×3): 50 ug via INTRAVENOUS

## 2019-07-28 MED ORDER — PHENOL 1.4 % MT LIQD: 1.0000 | OROMUCOSAL | Status: DC | PRN

## 2019-07-28 MED ORDER — PROPOFOL 500 MG/50ML IV EMUL
INTRAVENOUS | Status: AC
  Filled 2019-07-28: qty 100

## 2019-07-28 MED ORDER — SODIUM CHLORIDE 0.9 % IV SOLN: INTRAVENOUS | Status: DC

## 2019-07-28 MED ORDER — BUPIVACAINE IN DEXTROSE 0.75-8.25 % IT SOLN
INTRATHECAL | Status: DC | PRN
  Administered 2019-07-28: 1.6 mL via INTRATHECAL

## 2019-07-28 MED ORDER — STERILE WATER FOR IRRIGATION IR SOLN
Status: DC | PRN
  Administered 2019-07-28: 1000 mL

## 2019-07-28 MED ORDER — METHOCARBAMOL 500 MG IVPB - SIMPLE MED
500.0000 mg | Freq: Four times a day (QID) | INTRAVENOUS | Status: DC | PRN
  Administered 2019-07-28: 500 mg via INTRAVENOUS
  Filled 2019-07-28: qty 50

## 2019-07-28 MED ORDER — CEFAZOLIN SODIUM-DEXTROSE 2-4 GM/100ML-% IV SOLN
2.0000 g | Freq: Four times a day (QID) | INTRAVENOUS | Status: AC
  Administered 2019-07-28 (×2): 2 g via INTRAVENOUS
  Filled 2019-07-28 (×2): qty 100

## 2019-07-28 MED ORDER — PHENYLEPHRINE HCL-NACL 10-0.9 MG/250ML-% IV SOLN
INTRAVENOUS | Status: DC | PRN
  Administered 2019-07-28: 20 ug/min via INTRAVENOUS

## 2019-07-28 MED ORDER — FERROUS SULFATE 325 (65 FE) MG PO TABS
325.0000 mg | ORAL_TABLET | Freq: Three times a day (TID) | ORAL | Status: DC
  Administered 2019-07-28 – 2019-07-29 (×2): 325 mg via ORAL
  Filled 2019-07-28 (×2): qty 1

## 2019-07-28 MED ORDER — HYDROMORPHONE HCL 1 MG/ML IJ SOLN
INTRAMUSCULAR | Status: AC
  Filled 2019-07-28: qty 1

## 2019-07-28 MED ORDER — TRANEXAMIC ACID-NACL 1000-0.7 MG/100ML-% IV SOLN
1000.0000 mg | INTRAVENOUS | Status: AC
  Administered 2019-07-28: 12:00:00 1000 mg via INTRAVENOUS
  Filled 2019-07-28: qty 100

## 2019-07-28 MED ORDER — METHOCARBAMOL 500 MG PO TABS
500.0000 mg | ORAL_TABLET | Freq: Four times a day (QID) | ORAL | Status: DC | PRN
  Administered 2019-07-28 – 2019-07-29 (×2): 500 mg via ORAL
  Filled 2019-07-28 (×2): qty 1

## 2019-07-28 MED ORDER — LACTATED RINGERS IV SOLN: INTRAVENOUS | Status: DC

## 2019-07-28 MED ORDER — INSULIN ASPART 100 UNIT/ML ~~LOC~~ SOLN
0.0000 [IU] | Freq: Three times a day (TID) | SUBCUTANEOUS | Status: DC
  Administered 2019-07-28: 5 [IU] via SUBCUTANEOUS

## 2019-07-28 MED ORDER — LEVOTHYROXINE SODIUM 88 MCG PO TABS
88.0000 ug | ORAL_TABLET | Freq: Every day | ORAL | Status: DC
  Administered 2019-07-29: 88 ug via ORAL
  Filled 2019-07-28: qty 1

## 2019-07-28 SURGICAL SUPPLY — 49 items
ADH SKN CLS APL DERMABOND .7 (GAUZE/BANDAGES/DRESSINGS) ×1
BAG DECANTER FOR FLEXI CONT (MISCELLANEOUS) IMPLANT
BAG SPEC THK2 15X12 ZIP CLS (MISCELLANEOUS)
BAG ZIPLOCK 12X15 (MISCELLANEOUS) IMPLANT
BLADE SAG 18X100X1.27 (BLADE) ×3 IMPLANT
BLADE SURG SZ10 CARB STEEL (BLADE) ×6 IMPLANT
COVER PERINEAL POST (MISCELLANEOUS) ×3 IMPLANT
COVER SURGICAL LIGHT HANDLE (MISCELLANEOUS) ×3 IMPLANT
COVER WAND RF STERILE (DRAPES) IMPLANT
CUP ACETBLR 52 OD PINNACLE (Hips) ×2 IMPLANT
DERMABOND ADVANCED (GAUZE/BANDAGES/DRESSINGS) ×2
DERMABOND ADVANCED .7 DNX12 (GAUZE/BANDAGES/DRESSINGS) ×1 IMPLANT
DRAPE STERI IOBAN 125X83 (DRAPES) ×3 IMPLANT
DRAPE U-SHAPE 47X51 STRL (DRAPES) ×6 IMPLANT
DRESSING AQUACEL AG SP 3.5X10 (GAUZE/BANDAGES/DRESSINGS) ×1 IMPLANT
DRSG AQUACEL AG ADV 3.5X10 (GAUZE/BANDAGES/DRESSINGS) ×2 IMPLANT
DRSG AQUACEL AG SP 3.5X10 (GAUZE/BANDAGES/DRESSINGS) ×3
DURAPREP 26ML APPLICATOR (WOUND CARE) ×3 IMPLANT
ELECT BLADE TIP CTD 4 INCH (ELECTRODE) ×3 IMPLANT
ELECT REM PT RETURN 15FT ADLT (MISCELLANEOUS) ×3 IMPLANT
ELIMINATOR HOLE APEX DEPUY (Hips) ×2 IMPLANT
GLOVE BIO SURGEON STRL SZ 6 (GLOVE) ×6 IMPLANT
GLOVE BIOGEL PI IND STRL 6.5 (GLOVE) ×1 IMPLANT
GLOVE BIOGEL PI IND STRL 7.5 (GLOVE) ×1 IMPLANT
GLOVE BIOGEL PI IND STRL 8.5 (GLOVE) ×1 IMPLANT
GLOVE BIOGEL PI INDICATOR 6.5 (GLOVE) ×2
GLOVE BIOGEL PI INDICATOR 7.5 (GLOVE) ×2
GLOVE BIOGEL PI INDICATOR 8.5 (GLOVE) ×2
GLOVE ECLIPSE 8.0 STRL XLNG CF (GLOVE) ×6 IMPLANT
GLOVE ORTHO TXT STRL SZ7.5 (GLOVE) ×6 IMPLANT
GOWN STRL REUS W/TWL LRG LVL3 (GOWN DISPOSABLE) ×6 IMPLANT
GOWN STRL REUS W/TWL XL LVL3 (GOWN DISPOSABLE) ×3 IMPLANT
HEAD CERAMIC DELTA 36 PLUS 1.5 (Hips) ×2 IMPLANT
HOLDER FOLEY CATH W/STRAP (MISCELLANEOUS) ×3 IMPLANT
KIT TURNOVER KIT A (KITS) IMPLANT
LINER NEUTRAL 52X36MM PLUS 4 (Liner) ×2 IMPLANT
PACK ANTERIOR HIP CUSTOM (KITS) ×3 IMPLANT
PENCIL SMOKE EVACUATOR (MISCELLANEOUS) IMPLANT
SCREW 6.5MMX30MM (Screw) ×2 IMPLANT
STEM FEM ACTIS HIGH SZ3 (Stem) ×2 IMPLANT
SUT MNCRL AB 4-0 PS2 18 (SUTURE) ×3 IMPLANT
SUT STRATAFIX 0 PDS 27 VIOLET (SUTURE) ×3
SUT VIC AB 1 CT1 36 (SUTURE) ×9 IMPLANT
SUT VIC AB 2-0 CT1 27 (SUTURE) ×6
SUT VIC AB 2-0 CT1 TAPERPNT 27 (SUTURE) ×2 IMPLANT
SUTURE STRATFX 0 PDS 27 VIOLET (SUTURE) ×1 IMPLANT
TRAY FOLEY MTR SLVR 16FR STAT (SET/KITS/TRAYS/PACK) IMPLANT
WATER STERILE IRR 1000ML POUR (IV SOLUTION) ×3 IMPLANT
YANKAUER SUCT BULB TIP 10FT TU (MISCELLANEOUS) IMPLANT

## 2019-07-28 NOTE — Transfer of Care (Signed)
Immediate Anesthesia Transfer of Care Note  Patient: Ann Myers  Procedure(s) Performed: TOTAL HIP ARTHROPLASTY ANTERIOR APPROACH (Right Hip)2  Patient Location: PACU  Anesthesia Type:Spinal  Level of Consciousness: awake, drowsy and patient cooperative  Airway & Oxygen Therapy: Patient Spontanous Breathing and Patient connected to face mask oxygen  Post-op Assessment: Report given to RN and Post -op Vital signs reviewed and stable  Post vital signs: Reviewed and stable  Last Vitals:  Vitals Value Taken Time  BP 116/65 07/28/19 1330  Temp    Pulse 59 07/28/19 1332  Resp 14 07/28/19 1332  SpO2 100 % 07/28/19 1332  Vitals shown include unvalidated device data.  Last Pain:  Vitals:   07/28/19 0949  TempSrc:   PainSc: 0-No pain         Complications: No apparent anesthesia complications

## 2019-07-28 NOTE — Interval H&P Note (Signed)
History and Physical Interval Note:  07/28/2019 10:16 AM  Ann Myers  has presented today for surgery, with the diagnosis of Right hip osteoarthritis.  The various methods of treatment have been discussed with the patient and family. After consideration of risks, benefits and other options for treatment, the patient has consented to  Procedure(s) with comments: TOTAL HIP ARTHROPLASTY ANTERIOR APPROACH (Right) - 70 mins as a surgical intervention.  The patient's history has been reviewed, patient examined, no change in status, stable for surgery.  I have reviewed the patient's chart and labs.  Questions were answered to the patient's satisfaction.     [name]

## 2019-07-28 NOTE — Op Note (Signed)
NAME:  Ann Myers                ACCOUNT NO.: [id number]      MEDICAL RECORD NO.: [id number]      FACILITY:  [facility]      PHYSICIAN:  [name]  DATE OF BIRTH:  04/28/1956     DATE OF PROCEDURE:  07/28/2019                                 OPERATIVE REPORT         PREOPERATIVE DIAGNOSIS: Right  hip osteoarthritis.      POSTOPERATIVE DIAGNOSIS:  Right hip osteoarthritis.      PROCEDURE:  Right total hip replacement through an anterior approach   utilizing DePuy THR system, component size 25mm pinnacle cup, a size 36+4 neutral   Altrex liner, a size 3 Hi Actis stem with a 36+1.5 delta ceramic   ball.      SURGEON:  Madlyn Frankel. Charlann Boxer, M.D.      ASSISTANT:  Lanney Gins, PA-C     ANESTHESIA:  Spinal.      SPECIMENS:  None.      COMPLICATIONS:  None.      BLOOD LOSS:  250 cc     DRAINS:  None.      INDICATION OF THE PROCEDURE:  Ann Myers is a 64 y.o. female who had   presented to office for evaluation of right hip pain.  Radiographs revealed   progressive degenerative changes with bone-on-bone   articulation of the  hip joint, including subchondral cystic changes and osteophytes.  The patient had painful limited range of   motion significantly affecting their overall quality of life and function.  The patient was failing to    respond to conservative measures including medications and/or injections and activity modification and at this point was ready   to proceed with more definitive measures.  Consent was obtained for   benefit of pain relief.  Specific risks of infection, DVT, component   failure, dislocation, neurovascular injury, and need for revision surgery were reviewed in the office as well discussion of   the anterior versus posterior approach were reviewed.     PROCEDURE IN DETAIL:  The patient was brought to operative theater.   Once adequate anesthesia, preoperative antibiotics, 2 gm of Ancef, 1 gm of Tranexamic Acid, and 10 mg  of Decadron were administered, the patient was positioned supine on the [employer] table.  Once the patient was safely positioned with adequate padding of boney prominences we predraped out the hip, and used fluoroscopy to confirm orientation of the pelvis.      The right hip was then prepped and draped from proximal iliac crest to   mid thigh with a shower curtain technique.      Time-out was performed identifying the patient, planned procedure, and the appropriate extremity.     An incision was then made 2 cm lateral to the   anterior superior iliac spine extending over the orientation of the   tensor fascia lata muscle and sharp dissection was carried down to the   fascia of the muscle.      The fascia was then incised.  The muscle belly was identified and swept   laterally and retractor placed along the superior neck.  Following   cauterization of the circumflex vessels and removing some pericapsular  fat, a second cobra retractor was placed on the inferior neck.  A T-capsulotomy was made along the line of the   superior neck to the trochanteric fossa, then extended proximally and   distally.  Tag sutures were placed and the retractors were then placed   intracapsular.  We then identified the trochanteric fossa and   orientation of my neck cut and then made a neck osteotomy with the femur on traction.  The femoral   head was removed without difficulty or complication.  Traction was let   off and retractors were placed posterior and anterior around the   acetabulum.      The labrum and foveal tissue were debrided.  I began reaming with a 45 mm   reamer and reamed up to 51 mm reamer with good bony bed preparation and a 52 mm  cup was chosen.  The final 52 mm Pinnacle cup was then impacted under fluoroscopy to confirm the depth of penetration and orientation with respect to   Abduction and forward flexion.  A screw was placed into the ilium followed by the hole eliminator.  The final    36+4 neutral Altrex liner was impacted with good visualized rim fit.  The cup was positioned anatomically within the acetabular portion of the pelvis.      At this point, the femur was rolled to 100 degrees.  Further capsule was   released off the inferior aspect of the femoral neck.  I then   released the superior capsule proximally.  With the leg in a neutral position the hook was placed laterally   along the femur under the vastus lateralis origin and elevated manually and then held in position using the hook attachment on the bed.  The leg was then extended and adducted with the leg rolled to 100   degrees of external rotation.  Retractors were placed along the medial calcar and posteriorly over the greater trochanter.  Once the proximal femur was fully   exposed, I used a box osteotome to set orientation.  I then began   broaching with the starting chili pepper broach and passed this by hand and then broached up to 3.  With the 3 broach in place I chose a high offset neck and did several trial reductions.  The offset was appropriate, leg lengths   appeared to be equal best matched with the +1.5 head ball trial confirmed radiographically.   Given these findings, I went ahead and dislocated the hip, repositioned all   retractors and positioned the right hip in the extended and abducted position.  The final 3 Hi Actis stem was   chosen and it was impacted down to the level of neck cut.  Based on this   and the trial reductions, a final 36+1.5 delta ceramic ball was chosen and   impacted onto a clean and dry trunnion, and the hip was reduced.  The   hip had been irrigated throughout the case again at this point.  I did   reapproximate the superior capsular leaflet to the anterior leaflet   using #1 Vicryl.  The fascia of the   tensor fascia lata muscle was then reapproximated using #1 Vicryl and #0 Stratafix sutures.  The   remaining wound was closed with 2-0 Vicryl and running 4-0 Monocryl.    The hip was cleaned, dried, and dressed sterilely using Dermabond and   Aquacel dressing.  The patient was then brought   to recovery room in  stable condition tolerating the procedure well.    Lanney Gins, PA-C was present for the entirety of the case involved from   preoperative positioning, perioperative retractor management, general   facilitation of the case, as well as primary wound closure as assistant.            Madlyn Frankel Charlann Boxer, M.D.        07/28/2019 11:47 AM

## 2019-07-28 NOTE — Anesthesia Procedure Notes (Signed)
Spinal  Patient location during procedure: OR Start time: 07/28/2019 11:25 AM End time: 07/28/2019 11:30 AM Staffing Performed: anesthesiologist  Anesthesiologist: Leonides Grills, MD Preanesthetic Checklist Completed: patient identified, IV checked, risks and benefits discussed, surgical consent, monitors and equipment checked, pre-op evaluation and timeout performed Spinal Block Patient position: sitting Prep: ChloraPrep Patient monitoring: cardiac monitor, continuous pulse ox and blood pressure Approach: midline Location: L4-5 Injection technique: single-shot Needle Needle type: Pencan  Needle gauge: 24 G Needle length: 9 cm Assessment Sensory level: T10 Additional Notes Functioning IV was confirmed and monitors were applied. Sterile prep and drape, including hand hygiene and sterile gloves were used. The patient was positioned and the spine was prepped. The skin was anesthetized with lidocaine.  Free flow of clear CSF was obtained prior to injecting local anesthetic into the CSF.  The spinal needle aspirated freely following injection.  The needle was carefully withdrawn.  The patient tolerated the procedure well.

## 2019-07-28 NOTE — Anesthesia Postprocedure Evaluation (Signed)
Anesthesia Post Note  Patient: Ann Myers  Procedure(s) Performed: TOTAL HIP ARTHROPLASTY ANTERIOR APPROACH (Right Hip)     Patient location during evaluation: PACU Anesthesia Type: Spinal Level of consciousness: oriented and awake and alert Pain management: pain level controlled Vital Signs Assessment: post-procedure vital signs reviewed and stable Respiratory status: spontaneous breathing, respiratory function stable and patient connected to nasal cannula oxygen Cardiovascular status: blood pressure returned to baseline and stable Postop Assessment: no headache, no backache, no apparent nausea or vomiting and spinal receding Anesthetic complications: no    Last Vitals:  Vitals:   07/28/19 1530 07/28/19 1536  BP:  112/71  Pulse: 64 64  Resp:  17  Temp: 36.7 C (!) 36.4 C  SpO2: 100% 97%    Last Pain:  Vitals:   07/28/19 1600  TempSrc:   PainSc: 5                  Sophronia Varney P Harshal Sirmon

## 2019-07-28 NOTE — Anesthesia Preprocedure Evaluation (Addendum)
Anesthesia Evaluation  Patient identified by MRN, date of birth, ID band Patient awake    Reviewed: Allergy & Precautions, NPO status , Patient's Chart, lab work & pertinent test results  History of Anesthesia Complications (+) PONV and history of anesthetic complications  Airway Mallampati: II  TM Distance: >3 FB Neck ROM: Full    Dental no notable dental hx.    Pulmonary neg pulmonary ROS,    Pulmonary exam normal breath sounds clear to auscultation       Cardiovascular hypertension, Pt. on medications Normal cardiovascular exam Rhythm:Regular Rate:Normal  ECG: NSR, rate 71   Neuro/Psych PSYCHIATRIC DISORDERS Anxiety Depression negative neurological ROS     GI/Hepatic negative GI ROS, (+)     substance abuse  marijuana use,   Endo/Other  diabetesHypothyroidism   Renal/GU negative Renal ROS     Musculoskeletal negative musculoskeletal ROS (+)   Abdominal (+) + obese,   Peds  Hematology negative hematology ROS (+)   Anesthesia Other Findings Right hip osteoarthritis  Reproductive/Obstetrics                            Anesthesia Physical Anesthesia Plan  ASA: II  Anesthesia Plan: Spinal   Post-op Pain Management:    Induction: Intravenous  PONV Risk Score and Plan: 3 and Ondansetron, Dexamethasone, Midazolam and Treatment may vary due to age or medical condition  Airway Management Planned: Simple Face Mask  Additional Equipment:   Intra-op Plan:   Post-operative Plan:   Informed Consent: I have reviewed the patients History and Physical, chart, labs and discussed the procedure including the risks, benefits and alternatives for the proposed anesthesia with the patient or authorized representative who has indicated his/her understanding and acceptance.     Dental advisory given  Plan Discussed with: CRNA  Anesthesia Plan Comments:         Anesthesia Quick  Evaluation

## 2019-07-28 NOTE — Evaluation (Signed)
Physical Therapy Evaluation Patient Details Name: Ann Myers MRN: [id number] DOB: Apr 21, 1956 Today's Date: 07/28/2019   History of Present Illness  Patient is 64 y.o. female s/p Rt THA anterior approach on 07/28/19 with PMH significant for  hypothyroidism, HTN, DM, depression, anxiety, OA..    Clinical Impression  ARACELLI WOLOSZYN is a 64 y.o. female POD 0 s/p Rt THA anterior approach. Patient reports independence with mobility at baseline. Patient is now limited by functional impairments (see PT problem list below) and requires min assist for transfers and gait with RW. Patient was able to ambulate ~60 feet with RW and min assist. Patient instructed in exercise to facilitate ROM and circulation. Patient will benefit from continued skilled PT interventions to address impairments and progress towards PLOF. Acute PT will follow to progress mobility and stair training in preparation for safe discharge home.     Follow Up Recommendations Follow surgeon's recommendation for DC plan and follow-up therapies    Equipment Recommendations  None recommended by PT    Recommendations for Other Services       Precautions / Restrictions Precautions Precautions: Fall Restrictions Weight Bearing Restrictions: No      Mobility  Bed Mobility Overal bed mobility: Needs Assistance Bed Mobility: Supine to Sit     Supine to sit: HOB elevated;Min assist     General bed mobility comments: cues for sequencing LE movement and assist to bring Rt LE to EOB, assist to raise trunk upright.  Transfers Overall transfer level: Needs assistance Equipment used: Rolling walker (2 wheeled) Transfers: Sit to/from Stand Sit to Stand: Min assist         General transfer comment: cues for technique with RW and hand placement, assist required for power up and to steady with rise.  Ambulation/Gait Ambulation/Gait assistance: Min assist Gait Distance (Feet): 60 Feet Assistive device: Rolling walker (2  wheeled) Gait Pattern/deviations: Step-to pattern;Decreased stride length;Decreased stance time - right;Decreased step length - right Gait velocity: decreased   General Gait Details: cues for safe step sequencing and assist for RW management/positioning. pt allowing RW to get too far ahead of her intermittently requiring cues to correct.  Stairs         Wheelchair Mobility    Modified Rankin (Stroke Patients Only)       Balance Overall balance assessment: Needs assistance Sitting-balance support: Feet supported Sitting balance-Leahy Scale: Good     Standing balance support: During functional activity;Bilateral upper extremity supported Standing balance-Leahy Scale: Fair              Pertinent Vitals/Pain Pain Assessment: 0-10 Pain Score: 5  Pain Location: Rt hip Pain Descriptors / Indicators: Aching Pain Intervention(s): Limited activity within patient's tolerance;Monitored during session;Premedicated before session;Repositioned;Ice applied    Home Living Family/patient expects to be discharged to:: Private residence Living Arrangements: Children(pt lives with her son ) Available Help at Discharge: Family Type of Home: House Home Access: Stairs to enter Entrance Stairs-Rails: Can reach both Entrance Stairs-Number of Steps: [place]: One level Home Equipment: Environmental consultant - 2 wheels;Bedside commode;Cane - single point;Grab bars - tub/shower      Prior Function Level of Independence: Independent           Hand Dominance   Dominant Hand: Right    Extremity/Trunk Assessment   Upper Extremity Assessment Upper Extremity Assessment: Overall WFL for tasks assessed    Lower Extremity Assessment Lower Extremity Assessment: RLE deficits/detail RLE Deficits / Details: pt with good quad activation in supine RLE  Sensation: WNL RLE Coordination: WNL    Cervical / Trunk Assessment Cervical / Trunk Assessment: Normal  Communication   Communication: No  difficulties  Cognition Arousal/Alertness: Awake/alert Behavior During Therapy: WFL for tasks assessed/performed Overall Cognitive Status: Within Functional Limits for tasks assessed       General Comments      Exercises Total Joint Exercises Ankle Circles/Pumps: AROM;15 reps;Both;Seated Quad Sets: AROM;Right;5 reps;Seated Heel Slides: AROM;Right;Seated   Assessment/Plan    PT Assessment Patient needs continued PT services  PT Problem List Decreased strength;Decreased range of motion;Decreased activity tolerance;Decreased balance;Decreased mobility;Decreased knowledge of use of DME;Decreased knowledge of precautions       PT Treatment Interventions Gait training;DME instruction;Stair training;Functional mobility training;Therapeutic activities;Therapeutic exercise;Balance training;Patient/family education    PT Goals (Current goals can be found in the Care Plan section)  Acute Rehab PT Goals Patient Stated Goal: for hip to stop hurting PT Goal Formulation: With patient Time For Goal Achievement: 08/04/19 Potential to Achieve Goals: Good    Frequency 7X/week    AM-PAC PT "6 Clicks" Mobility  Outcome Measure Help needed turning from your back to your side while in a flat bed without using bedrails?: A Little Help needed moving from lying on your back to sitting on the side of a flat bed without using bedrails?: A Little Help needed moving to and from a bed to a chair (including a wheelchair)?: A Little Help needed standing up from a chair using your arms (e.g., wheelchair or bedside chair)?: A Little Help needed to walk in hospital room?: A Little Help needed climbing 3-5 steps with a railing? : A Little 6 Click Score: 18    End of Session Equipment Utilized During Treatment: Gait belt Activity Tolerance: Patient tolerated treatment well Patient left: in chair;with call bell/phone within reach;with chair alarm set Nurse Communication: Mobility status PT Visit Diagnosis:  Muscle weakness (generalized) (M62.81);Difficulty in walking, not elsewhere classified (R26.2)    Time: 7353-2992 PT Time Calculation (min) (ACUTE ONLY): 17 min   Charges:   PT Evaluation $PT Eval Low Complexity: 1 Low          Wynn Maudlin, DPT Physical Therapist with [facility] [phone]  07/28/2019 5:08 PM

## 2019-07-29 ENCOUNTER — Encounter: Payer: Self-pay | Admitting: *Deleted

## 2019-07-29 DIAGNOSIS — M1611 Unilateral primary osteoarthritis, right hip: Secondary | ICD-10-CM | POA: Diagnosis not present

## 2019-07-29 DIAGNOSIS — E669 Obesity, unspecified: Secondary | ICD-10-CM | POA: Diagnosis present

## 2019-07-29 LAB — CBC
HCT: 42.1 % (ref 36.0–46.0)
Hemoglobin: 13.8 g/dL (ref 12.0–15.0)
MCH: 30.8 pg (ref 26.0–34.0)
MCHC: 32.8 g/dL (ref 30.0–36.0)
MCV: 94 fL (ref 80.0–100.0)
Platelets: 243 10*3/uL (ref 150–400)
RBC: 4.48 MIL/uL (ref 3.87–5.11)
RDW: 13.1 % (ref 11.5–15.5)
WBC: 10 10*3/uL (ref 4.0–10.5)
nRBC: 0 % (ref 0.0–0.2)

## 2019-07-29 LAB — BASIC METABOLIC PANEL
Anion gap: 10 (ref 5–15)
BUN: 13 mg/dL (ref 8–23)
CO2: 24 mmol/L (ref 22–32)
Calcium: 8.7 mg/dL — ABNORMAL LOW (ref 8.9–10.3)
Chloride: 104 mmol/L (ref 98–111)
Creatinine, Ser: 0.58 mg/dL (ref 0.44–1.00)
GFR calc Af Amer: >60 mL/min (ref >60)
GFR calc non Af Amer: >60 mL/min (ref >60)
Glucose, Bld: 145 mg/dL — ABNORMAL HIGH (ref 70–99)
Potassium: 4 mmol/L (ref 3.5–5.1)
Sodium: 138 mmol/L (ref 135–145)

## 2019-07-29 LAB — GLUCOSE, CAPILLARY: Glucose-Capillary: 124 mg/dL — ABNORMAL HIGH (ref 70–99)

## 2019-07-29 MED ORDER — DOCUSATE SODIUM 100 MG PO CAPS: 100.0000 mg | ORAL_CAPSULE | Freq: Two times a day (BID) | ORAL | 0 refills | Status: DC

## 2019-07-29 MED ORDER — METHOCARBAMOL 500 MG PO TABS: 500.0000 mg | ORAL_TABLET | Freq: Four times a day (QID) | ORAL | 0 refills | Status: DC | PRN

## 2019-07-29 MED ORDER — ACETAMINOPHEN 500 MG PO TABS: 1000.0000 mg | ORAL_TABLET | Freq: Three times a day (TID) | ORAL | 0 refills | Status: DC

## 2019-07-29 MED ORDER — OXYCODONE HCL 5 MG PO TABS: 5.0000 mg | ORAL_TABLET | ORAL | 0 refills | Status: DC | PRN

## 2019-07-29 MED ORDER — FERROUS SULFATE 325 (65 FE) MG PO TABS: 325.0000 mg | ORAL_TABLET | Freq: Three times a day (TID) | ORAL | 0 refills | Status: DC

## 2019-07-29 MED ORDER — ASPIRIN 81 MG PO CHEW: 81.0000 mg | CHEWABLE_TABLET | Freq: Two times a day (BID) | ORAL | 0 refills | Status: AC

## 2019-07-29 MED ORDER — POLYETHYLENE GLYCOL 3350 17 G PO PACK: 17.0000 g | PACK | Freq: Two times a day (BID) | ORAL | 0 refills | Status: DC

## 2019-07-29 NOTE — Plan of Care (Signed)
  Problem: Education: Goal: Knowledge of the prescribed therapeutic regimen will improve Outcome: Progressing   Problem: Pain Management: Goal: Pain level will decrease with appropriate interventions Outcome: Progressing   Problem: Activity: Goal: Ability to avoid complications of mobility impairment will improve Outcome: Progressing   Problem: Education: Goal: Knowledge of the prescribed therapeutic regimen will improve Outcome: Progressing

## 2019-07-29 NOTE — Progress Notes (Signed)
     Subjective: 1 Day Post-Op Procedure(s) (LRB): TOTAL HIP ARTHROPLASTY ANTERIOR APPROACH (Right)   Patient reports pain as mild, pain controlled.  No reported events throughout the night.  Dr. Charlann Boxer discussed the procedure, findings and expectations moving forward.  Patient be discharged home, she does well therapy.   Objective:   VITALS:   Vitals:   07/29/19 0204 07/29/19 0633  BP: 136/78 123/67  Pulse: 70 69  Resp: 18 18  Temp: 97.8 F (36.6 C) 97.8 F (36.6 C)  SpO2: 97% 97%    Dorsiflexion/Plantar flexion intact Incision: dressing C/D/I No cellulitis present Compartment soft  LABS Recent Labs    07/29/19 0317  HGB 13.8  HCT 42.1  WBC 10.0  PLT 243    Recent Labs    07/29/19 0317  NA 138  K 4.0  BUN 13  CREATININE 0.58  GLUCOSE 145*     Assessment/Plan: 1 Day Post-Op Procedure(s) (LRB): TOTAL HIP ARTHROPLASTY ANTERIOR APPROACH (Right) Foley cath DC'd Advance diet Up with therapy D/C IV fluids Discharge home Follow up in 2 weeks at [facility] Follow up with OLIN,Letzy Gullickson D in 2 weeks.  Contact information:  [facility] [address], [address] [place] [address] [phone]    Obese (BMI 30-39.9) Estimated body mass index is 38.27 kg/m as calculated from the following:   Height as of this encounter: 5\' 5"  (1.651 m).   Weight as of this encounter: 104.3 kg. Patient also counseled that weight may inhibit the healing process Patient counseled that losing weight will help with future health issues             PA-C  [facility]  [place] [address], [place], [place] Phone: [phone] [email or web address] Facebook  [phone]

## 2019-07-29 NOTE — Plan of Care (Signed)
resolved 

## 2019-07-29 NOTE — Progress Notes (Signed)
Physical Therapy Treatment Patient Details Name: Ann Myers MRN: [id number] DOB: [birth year] Today's Date: 07/29/2019    History of Present Illness Patient is 64 y.o. female s/p Rt THA anterior approach on 07/28/19 with PMH significant for  hypothyroidism, HTN, DM, depression, anxiety, OA..    PT Comments    Pt ambulated 140' with RW, completed stair training, and demonstrates good understanding of HEP. She is ready to DC home from PT standpoint.   Follow Up Recommendations  Follow surgeon's recommendation for DC plan and follow-up therapies     Equipment Recommendations  None recommended by PT    Recommendations for Other Services       Precautions / Restrictions Precautions Precautions: Fall Restrictions Weight Bearing Restrictions: No    Mobility  Bed Mobility Overal bed mobility: Needs Assistance Bed Mobility: Supine to Sit     Supine to sit: HOB elevated;Min assist     General bed mobility comments: cues for sequencing LE movement and assist to bring Rt LE to EOB, assist to support RLE  Transfers Overall transfer level: Needs assistance Equipment used: Rolling walker (2 wheeled) Transfers: Sit to/from Stand Sit to Stand: Supervision         General transfer comment: cues for technique with RW and hand placement  Ambulation/Gait Ambulation/Gait assistance: Min guard Gait Distance (Feet): 140 Feet Assistive device: Rolling walker (2 wheeled) Gait Pattern/deviations: Step-to pattern;Decreased stride length;Decreased stance time - right;Decreased step length - right;Step-through pattern Gait velocity: decreased   General Gait Details: cues for safe step sequencing and assist for RW management/positioning. pt allowing RW to get too far ahead of her intermittently requiring cues to correct.   Stairs Stairs: Yes Stairs assistance: Min guard Stair Management: One rail Left;Forwards;With cane;Step to pattern Number of Stairs: 3 General stair comments: VCs  sequencing   Wheelchair Mobility    Modified Rankin (Stroke Patients Only)       Balance Overall balance assessment: Needs assistance Sitting-balance support: Feet supported Sitting balance-Leahy Scale: Good     Standing balance support: During functional activity;Bilateral upper extremity supported Standing balance-Leahy Scale: Fair                              Cognition Arousal/Alertness: Awake/alert Behavior During Therapy: WFL for tasks assessed/performed Overall Cognitive Status: Within Functional Limits for tasks assessed                                        Exercises Total Joint Exercises Ankle Circles/Pumps: AROM;15 reps;Both;Seated Quad Sets: AROM;Right;5 reps;Seated Short Arc Quad: AROM;Right;10 reps;Supine Heel Slides: Right;AAROM;Supine;10 reps Hip ABduction/ADduction: AAROM;Right;10 reps;Supine Long Arc Quad: AROM;Right;5 reps;Seated    General Comments        Pertinent Vitals/Pain Pain Score: 6  Pain Location: R hip with walking Pain Descriptors / Indicators: Sore Pain Intervention(s): Limited activity within patient's tolerance;Monitored during session;Premedicated before session;Ice applied    Home Living                      Prior Function            PT Goals (current goals can now be found in the care plan section) Acute Rehab PT Goals Patient Stated Goal: swimming PT Goal Formulation: With patient Time For Goal Achievement: 08/04/19 Potential to Achieve Goals: Good Progress towards PT goals: Progressing toward goals  Frequency    7X/week      PT Plan Current plan remains appropriate    Co-evaluation              AM-PAC PT "6 Clicks" Mobility   Outcome Measure  Help needed turning from your back to your side while in a flat bed without using bedrails?: A Little Help needed moving from lying on your back to sitting on the side of a flat bed without using bedrails?: A  Little Help needed moving to and from a bed to a chair (including a wheelchair)?: A Little Help needed standing up from a chair using your arms (e.g., wheelchair or bedside chair)?: A Little Help needed to walk in hospital room?: A Little Help needed climbing 3-5 steps with a railing? : A Little 6 Click Score: 18    End of Session Equipment Utilized During Treatment: Gait belt Activity Tolerance: Patient tolerated treatment well Patient left: in chair;with call bell/phone within reach Nurse Communication: Mobility status PT Visit Diagnosis: Muscle weakness (generalized) (M62.81);Difficulty in walking, not elsewhere classified (R26.2)     Time: 5093-2671 PT Time Calculation (min) (ACUTE ONLY): 38 min  Charges:  $Gait Training: 8-22 mins $Therapeutic Exercise: 8-22 mins $Therapeutic Activity: 8-22 mins                     Ralene Bathe Kistler PT 07/29/2019  Acute Rehabilitation Services Pager [phone] Office [phone]

## 2019-07-29 NOTE — Discharge Instructions (Signed)

## 2019-08-04 NOTE — Discharge Summary (Signed)
Physician Discharge Summary  Patient ID: Ann Myers MRN: [id number] DOB/AGE: 05-21-1956 64 y.o.  Admit date: 07/28/2019 Discharge date: 07/29/2019   Procedures:  Procedure(s) (LRB): TOTAL HIP ARTHROPLASTY ANTERIOR APPROACH (Right)  Attending Physician:  Dr. Durene Romans   Admission Diagnoses:   Right hip primary OA / pain  Discharge Diagnoses:  Principal Problem:   S/P right THA, AA Active Problems:   Obese  Past Medical History:  Diagnosis Date  . Anxiety   . Arthritis   . Complication of anesthesia   . Depression   . Diabetes mellitus without complication (HCC)   . Hypertension   . Hypothyroidism   . PONV (postoperative nausea and vomiting)     HPI:    Ann Myers, 64 y.o. female, has a history of pain and functional disability in the right hip(s) due to arthritis and patient has failed non-surgical conservative treatments for greater than 12 weeks to include NSAID's and/or analgesics, use of assistive devices and activity modification.  Onset of symptoms was gradual starting <1 year ago with rapidlly worsening course since that time.The patient noted no past surgery on the right hip(s).  Patient currently rates pain in the right hip at 10 out of 10 with activity. Patient has worsening of pain with activity and weight bearing, trendelenberg gait, pain that interfers with activities of daily living and pain with passive range of motion. Patient has evidence of periarticular osteophytes and joint space narrowing by imaging studies. This condition presents safety issues increasing the risk of falls. There is no current active infection.  Risks, benefits and expectations were discussed with the patient.  Risks including but not limited to the risk of anesthesia, blood clots, nerve damage, blood vessel damage, failure of the prosthesis, infection and up to and including death.  Patient understand the risks, benefits and expectations and wishes to proceed with surgery.   PCP:  Donata Duff, DO   Discharged Condition: good  Hospital Course:  Patient underwent the above stated procedure on 07/28/2019. Patient tolerated the procedure well and brought to the recovery room in good condition and subsequently to the floor.  POD #1 BP: 123/67 ; Pulse: 69 ; Temp: 97.8 F (36.6 C) ; Resp: 18 Patient reports pain as mild, pain controlled.  No reported events throughout the night.  Dr. Charlann Boxer discussed the procedure, findings and expectations moving forward.  Patient be discharged home. Dorsiflexion/plantar flexion intact, incision: dressing C/D/I, no cellulitis present and compartment soft.   LABS  Basename    HGB     13.8  HCT     42.1    Discharge Exam: General appearance: alert, cooperative and no distress Extremities: Homans sign is negative, no sign of DVT, no edema, redness or tenderness in the calves or thighs and no ulcers, gangrene or trophic changes  Disposition: Home with follow up in 2 weeks   Follow-up Information    Durene Romans, MD. Schedule an appointment as soon as possible for a visit in 2 week(s).   Specialty: Orthopedic Surgery Contact information: [address] [place] [address] [place] [address] [phone]           Discharge Instructions    Call MD / Call 911   Complete by: As directed    If you experience chest pain or shortness of breath, CALL 911 and be transported to the hospital emergency room.  If you develope a fever above 101 F, pus (white drainage) or increased drainage or redness at the  wound, or calf pain, call your surgeon's office.   Change dressing   Complete by: As directed    Maintain surgical dressing until follow up in the clinic. If the edges start to pull up, may reinforce with tape. If the dressing is no longer working, may remove and cover with gauze and tape, but must keep the area dry and clean.  Call with any questions or concerns.   Constipation Prevention   Complete by: As directed    Drink plenty of  fluids.  Prune juice may be helpful.  You may use a stool softener, such as Colace (over the counter) 100 mg twice a day.  Use MiraLax (over the counter) for constipation as needed.   Diet - low sodium heart healthy   Complete by: As directed    Discharge instructions   Complete by: As directed    Maintain surgical dressing until follow up in the clinic. If the edges start to pull up, may reinforce with tape. If the dressing is no longer working, may remove and cover with gauze and tape, but must keep the area dry and clean.  Follow up in 2 weeks at [facility]. Call with any questions or concerns.   Increase activity slowly as tolerated   Complete by: As directed    Weight bearing as tolerated with assist device (walker, cane, etc) as directed, use it as long as suggested by your surgeon or therapist, typically at least 4-6 weeks.   TED hose   Complete by: As directed    Use stockings (TED hose) for 2 weeks on both leg(s).  You may remove them at night for sleeping.      Allergies as of 07/29/2019      Reactions   Benactyzine Hives   Other Hives   Povidone Iodine Hives   Short Ragweed Pollen Ext Hives   Adhesive [tape] Other (See Comments)   Steri-strips/Welp   Latex Other (See Comments)   Dry Blister      Medication List    STOP taking these medications   ibuprofen 200 MG tablet Commonly known as: ADVIL     TAKE these medications   acetaminophen 500 MG tablet Commonly known as: TYLENOL Take 1,000 mg by mouth every 6 (six) hours as needed for mild pain or moderate pain. What changed: Another medication with the same name was added. Make sure you understand how and when to take each.   acetaminophen 500 MG tablet Commonly known as: TYLENOL Take 2 tablets (1,000 mg total) by mouth every 8 (eight) hours. What changed: You were already taking a medication with the same name, and this prescription was added. Make sure you understand how and when to take each.   APPLE CIDER  VINEGAR PO Take 1,000 mg by mouth daily.   aspirin 81 MG chewable tablet Commonly known as: Aspirin Childrens Chew 1 tablet (81 mg total) by mouth 2 (two) times daily. Take for 4 weeks, then resume regular dose.   dicyclomine 10 MG capsule Commonly known as: BENTYL Take 10 mg by mouth 3 (three) times daily as needed for diarrhea or loose stools.   docusate sodium 100 MG capsule Commonly known as: Colace Take 1 capsule (100 mg total) by mouth 2 (two) times daily.   DULoxetine 60 MG capsule Commonly known as: CYMBALTA Take 60 mg by mouth daily.   ferrous sulfate 325 (65 FE) MG tablet Commonly known as: FerrouSul Take 1 tablet (325 mg total) by mouth 3 (three) times  daily with meals for 14 days.   Invokana 100 MG Tabs tablet Generic drug: canagliflozin Take 100 mg by mouth daily.   levothyroxine 88 MCG tablet Commonly known as: SYNTHROID Take 88 mcg by mouth daily.   losartan-hydrochlorothiazide 50-12.5 MG tablet Commonly known as: HYZAAR Take 1 tablet by mouth daily.   methocarbamol 500 MG tablet Commonly known as: Robaxin Take 1 tablet (500 mg total) by mouth every 6 (six) hours as needed for muscle spasms.   Multi-Vitamin tablet Take 1 tablet by mouth daily. Woman 50+   NASACORT AQ NA Place 1 spray into the nose daily as needed (congestion).   oxyCODONE 5 MG immediate release tablet Commonly known as: Oxy IR/ROXICODONE Take 1-2 tablets (5-10 mg total) by mouth every 4 (four) hours as needed for moderate pain or severe pain.   Ozempic (0.25 or 0.5 MG/DOSE) 2 MG/1.5ML Sopn Generic drug: Semaglutide(0.25 or 0.5MG /DOS) Inject 0.5 mg into the skin once a week. Friday   polyethylene glycol 17 g packet Commonly known as: MIRALAX / GLYCOLAX Take 17 g by mouth 2 (two) times daily.   SYSTANE FREE OP Place 1 drop into both eyes daily as needed (Dry eye).   tolterodine 4 MG 24 hr capsule Commonly known as: DETROL LA Take 4 mg by mouth daily.              Discharge Care Instructions  (From admission, onward)         Start     Ordered   07/29/19 0000  Change dressing    Comments: Maintain surgical dressing until follow up in the clinic. If the edges start to pull up, may reinforce with tape. If the dressing is no longer working, may remove and cover with gauze and tape, but must keep the area dry and clean.  Call with any questions or concerns.   07/29/19 0827           Signed: [place] Pugh. Denajah Farias   PA-C  08/04/2019, 10:54 AM

## 2020-05-04 ENCOUNTER — Encounter: Payer: Self-pay | Admitting: Neurology

## 2020-05-11 ENCOUNTER — Ambulatory Visit (INDEPENDENT_AMBULATORY_CARE_PROVIDER_SITE_OTHER): Payer: Medicare Other | Admitting: Neurology

## 2020-05-11 ENCOUNTER — Encounter: Payer: Self-pay | Admitting: Neurology

## 2020-05-11 VITALS — BP 131/85 | HR 71 | Ht 65.0 in | Wt 243.5 lb

## 2020-05-11 DIAGNOSIS — M21372 Foot drop, left foot: Secondary | ICD-10-CM | POA: Diagnosis not present

## 2020-05-11 DIAGNOSIS — M25562 Pain in left knee: Secondary | ICD-10-CM

## 2020-05-11 DIAGNOSIS — G5730 Lesion of lateral popliteal nerve, unspecified lower limb: Secondary | ICD-10-CM

## 2020-05-11 NOTE — Progress Notes (Signed)
[facility]  PATIENT: [name] DOB: Sep 20, 1955  REFERRING DOCTOR OR PCP:  Dr. Victorino Dike ([facility]); Dr. Otelia Santee (PCP) SOURCE: Patient, notes from orthopedics  _________________________________   HISTORICAL  CHIEF COMPLAINT:  Chief Complaint  Patient presents with  . New Patient (Initial Visit)    Referral from Dr. Toni Arthurs with [facility]  . Gait Problem    Left foot drop since August 2021.    HISTORY OF PRESENT ILLNESS:  I had the pleasure of seeing patient, Ann Myers, at [facility] for neurologic consultation regarding her left foot drop.  She is a 64 year old woman who had right THR March 2021.  Around July 2021 she noted her balance was poor and she was falling some.   She noted she was tripping over her left foot and dragging it. It has slightly weakened over the last couple months.    She had difficulty with stairs.   She did not note any numbness or new pain in her left lg.   She does have some  Chronic pain in her knee and back but the hip pain had been her worse pain and that improved after surgery.    She notes no numbness in her left foot.   She has no symptoms on the right.  She does not recall any injury near the fibular head.   She did note some intensification of her knee pain around the time the drop foot started.  Also she had some swelling in the knee region at that time.      She uses Biofreeze and CBD ointment with benefit.  She also notes benefit with ibuprofen.    She has had lumbar MRI last year and it showed degenerative changes.   She saw Dr, Ethelene Hal but opted not to pursur further back treatments as hr hip pain resolved with the THR.      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     REVIEW OF SYSTEMS: Constitutional: No fevers, chills, sweats, or change in appetite Eyes: No visual changes, double vision, eye pain Ear, nose and throat: No hearing loss, ear pain, nasal  congestion, sore throat Cardiovascular: No chest pain, palpitations Respiratory: No shortness of breath at rest or with exertion.   No wheezes GastrointestinaI: No nausea, vomiting, diarrhea, abdominal pain, fecal incontinence Genitourinary: No dysuria, urinary retention or frequency.  No nocturia. Musculoskeletal: No neck pain, back pain Integumentary: No rash, pruritus, skin lesions Neurological: as above Psychiatric: No depression at this time.  No anxiety Endocrine: No palpitations, diaphoresis, change in appetite, change in weigh or increased thirst Hematologic/Lymphatic: No anemia, purpura, petechiae. Allergic/Immunologic: No itchy/runny eyes, nasal congestion, recent allergic reactions, rashes  ALLERGIES: Allergies  Allergen Reactions  . Benactyzine Hives  . Other Hives  . Povidone Iodine Hives  . Short Ragweed Pollen Ext Hives  . Adhesive [Tape] Other (See Comments)    Steri-strips/Welp  . Latex Other (See Comments)    Dry Blister    HOME MEDICATIONS:  Current Outpatient Medications:  .  atorvastatin (LIPITOR) 10 MG tablet, Take 1 tablet by mouth daily., Disp: , Rfl:  .  dicyclomine (BENTYL) 10 MG capsule, Take 10 mg by mouth 3 (three) times daily as needed for diarrhea or loose stools., Disp: , Rfl:  .  DULoxetine (CYMBALTA) 60 MG capsule, Take 60 mg by mouth daily., Disp: , Rfl:  .  INVOKANA 100 MG TABS tablet, Take  100 mg by mouth daily., Disp: , Rfl:  .  levothyroxine (SYNTHROID) 88 MCG tablet, Take 88 mcg by mouth daily., Disp: , Rfl:  .  losartan-hydrochlorothiazide (HYZAAR) 50-12.5 MG tablet, Take 1 tablet by mouth daily., Disp: , Rfl:  .  OZEMPIC, 0.25 OR 0.5 MG/DOSE, 2 MG/1.5ML SOPN, Inject 0.5 mg into the skin once a week. Friday, Disp: , Rfl:  .  tolterodine (DETROL LA) 4 MG 24 hr capsule, Take 4 mg by mouth daily., Disp: , Rfl:   PAST MEDICAL HISTORY: Past Medical History:  Diagnosis Date  . Anxiety   . Arthritis   . Complication of anesthesia   .  Depression   . Diabetes mellitus without complication (HCC)   . Hypertension   . Hypothyroidism   . PONV (postoperative nausea and vomiting)     PAST SURGICAL HISTORY: Past Surgical History:  Procedure Laterality Date  . CESAREAN SECTION  1988  . CHOLECYSTECTOMY  2015  . DILATION AND CURETTAGE OF UTERUS  2020   Dr. Dareen Piano at [facility]  and at 64 years old-for irregular bleeding  . EYE SURGERY     brow lift surgery  . ganglion cyst of foot  01/2019   left foot  . HERNIA REPAIR  2014   incisional hernia repair-and did tummy tuck at that time  . LAPAROSCOPIC GASTRIC BANDING  2009  . left shoulder surgery     shaving of left shoulder-arthritis  . TOTAL HIP ARTHROPLASTY Right 07/28/2019   Procedure: TOTAL HIP ARTHROPLASTY ANTERIOR APPROACH;  Surgeon: Durene Romans, MD;  Location: [facility];  Service: Orthopedics;  Laterality: Right;  70 mins    FAMILY HISTORY: No family history on file.  SOCIAL HISTORY:  Social History   Socioeconomic History  . Marital status: Widowed    Spouse name: Not on file  . Number of children: 2  . Years of education: Not on file  . Highest education level: Not on file  Occupational History  . Not on file  Tobacco Use  . Smoking status: Never Smoker  . Smokeless tobacco: Never Used  Vaping Use  . Vaping Use: Never used  Substance and Sexual Activity  . Alcohol use: Yes    Comment: occassionally  . Drug use: Yes    Frequency: 7.0 times per week    Types: Marijuana    Comment: smoked last night 07/20/2019 at interview 07/21/2019  . Sexual activity: Not on file  Other Topics Concern  . Not on file  Social History Narrative  . Not on file   Social Determinants of Health   Financial Resource Strain: Not on file  Food Insecurity: Not on file  Transportation Needs: Not on file  Physical Activity: Not on file  Stress: Not on file  Social Connections: Not on file  Intimate Partner Violence: Not on file     PHYSICAL EXAM  Vitals:    05/11/20 1016  BP: 131/85  Pulse: 71  Weight: 243 lb 8 oz (110.5 kg)  Height: 5\' 5"  (1.651 m)    Body mass index is 40.52 kg/m.   General: The patient is well-developed and well-nourished and in no acute distress  HEENT:  Head is [place]/AT.  Sclera are anicteric.    Neck: No carotid bruits are noted.  The neck has good ROM.  Cardiovascular: The heart has a regular rate and rhythm with a normal S1 and S2. There were no murmurs, gallops or rubs.    Skin: Extremities are without rash or  edema.  Musculoskeletal:  Back is nontender.  There is some tenderness over the fibular head region  Neurologic Exam  Mental status: The patient is alert and oriented x 3 at the time of the examination. The patient has apparent normal recent and remote memory, with an apparently normal attention span and concentration ability.   Speech is normal.  Cranial nerves: Extraocular movements are full.   Facial symmetry is present. There is good facial sensation to soft touch bilaterally.Facial strength is normal.  Trapezius and sternocleidomastoid strength is normal. No dysarthria is noted.  No obvious hearing deficits are noted.  Motor:  Muscle bulk is normal.   Tone is normal. Strength is 2/5 in the left toe extensors and 2+/5 in the left ankle extensors and 5/5 elsewhere in the left leg and in the right leg and both arms  Sensory: Sensory testing is intact to pinprick, soft touch and vibration sensation in all 4 extremities except reduced sensation in the dorsum of the left foot between the first and second toes.  Sensation elsewhere was normal.  Other: She has a Tinel's sign over the fibular head with some dysesthetic sensation going into the foot  Coordination: Cerebellar testing reveals good finger-nose-finger and heel-to-shin bilaterally.  Gait and station: Station is normal.   Gait shows a left foot drop. Romberg is negative.   Reflexes: Deep tendon reflexes are symmetric and normal bilaterally.          DIAGNOSTIC DATA (LABS, IMAGING, TESTING) - I reviewed patient records, labs, notes, testing and imaging myself where available.  Lab Results  Component Value Date   WBC 10.0 07/29/2019   HGB 13.8 07/29/2019   HCT 42.1 07/29/2019   MCV 94.0 07/29/2019   PLT 243 07/29/2019      Component Value Date/Time   NA 138 07/29/2019 0317   K 4.0 07/29/2019 0317   CL 104 07/29/2019 0317   CO2 24 07/29/2019 0317   GLUCOSE 145 (H) 07/29/2019 0317   BUN 13 07/29/2019 0317   CREATININE 0.58 07/29/2019 0317   CALCIUM 8.7 (L) 07/29/2019 0317   GFRNONAA >60 07/29/2019 0317   GFRAA >60 07/29/2019 0317   No results found for: CHOL, HDL, LDLCALC, LDLDIRECT, TRIG, CHOLHDL Lab Results  Component Value Date   HGBA1C 6.2 (H) 07/24/2019       ASSESSMENT AND PLAN  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 Devincenzi is a 64 year old woman with a left foot drop that started about 5 months ago and has persisted.  She feels she may be slightly worse now than she was a couple months ago but definitely has not noted any improvement.  Her exam is consistent with a peroneal neuropathy mostly affecting the deep peroneal branch.  She is tender with a Tinel's sign at the fibular head.  We will check an NCV/EMG study.  I will see her for the EMG portion of that test.  If it confirms the peroneal neuropathy and she is not better or continues to progress then decompression surgery might be of benefit.  Thank you for asking me to see Ms. Taboada.  Please let me know if I can be of further assistance with her or other patients in the future.   Emunah Texidor A. Epimenio Foot, MD, [facility] 05/11/2020, 10:32 AM Certified in Neurology, Clinical Neurophysiology, Sleep Medicine and Neuroimaging  [facility]  [address], [address] [place], [place]  [id number] [phone]

## 2020-05-24 ENCOUNTER — Ambulatory Visit: Payer: Medicare Other | Admitting: Neurology

## 2020-05-31 ENCOUNTER — Ambulatory Visit (INDEPENDENT_AMBULATORY_CARE_PROVIDER_SITE_OTHER): Payer: Medicare Other | Admitting: Neurology

## 2020-05-31 ENCOUNTER — Encounter (INDEPENDENT_AMBULATORY_CARE_PROVIDER_SITE_OTHER): Payer: Medicare Other | Admitting: Neurology

## 2020-05-31 DIAGNOSIS — Z0289 Encounter for other administrative examinations: Secondary | ICD-10-CM

## 2020-05-31 DIAGNOSIS — H9041 Sensorineural hearing loss, unilateral, right ear, with unrestricted hearing on the contralateral side: Secondary | ICD-10-CM

## 2020-05-31 DIAGNOSIS — M21372 Foot drop, left foot: Secondary | ICD-10-CM

## 2020-05-31 DIAGNOSIS — R42 Dizziness and giddiness: Secondary | ICD-10-CM

## 2020-05-31 DIAGNOSIS — G5730 Lesion of lateral popliteal nerve, unspecified lower limb: Secondary | ICD-10-CM

## 2020-05-31 DIAGNOSIS — G5732 Lesion of lateral popliteal nerve, left lower limb: Secondary | ICD-10-CM | POA: Diagnosis not present

## 2020-05-31 NOTE — Progress Notes (Signed)
Full Name:  Ann Myers Gender: Female MRN #:   [id number] Date of Birth: 07-06-1955    Visit Date: 05/31/2020 08:23 Age: 65 Years Examining Physician: Despina Arias, MD  Referring Physician: Despina Arias, MD   History: [name] is a 65 year old woman with a left foot drop that developed around July 2021 and worsened over the next few months.  At its worst, she had difficulty raising her left ankle and toe toes.  However, over the past couple weeks strength has improved some and her foot drop is much better.  She is able to walk more.  On exam, strength is 4 -/5 in the peroneal innervated muscles of the left leg and 5/5 elsewhere.  Nerve conduction studies: The left peroneal motor response had a normal distal latency and low normal amplitude with normal velocity proximally.  The right peroneal motor response had an amplitude that was more than double that of the left and also had normal distal latency and conduction velocity.  The left tibial motor response was normal.  The left sural, left superficial peroneal and right superficial peroneal sensory responses had normal peak latencies and normal or near normal amplitudes.  Electromyography: Needle EMG of selected muscles of the left leg was performed.  She had moderate chronic denervation in the peroneal innervated muscles.  There was no acute denervation in these or other muscles.  Motor unit morphology and recruitment was normal in other muscles tested.  Impression: This NCV/EMG study shows the following: 1.   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  There is no ongoing denervation.  She is improving. 2.   No evidence of significant radiculopathy or other neuropathy.  Kiauna Zywicki A. Epimenio Foot, MD, PhD, FAAN Certified in Neurology, Clinical Neurophysiology, Sleep Medicine, Pain Medicine and Neuroimaging Director, [facility] at [facility]  [facility] [address] [place], [place] [address] [phone]          [facility]  PATIENT: GRAHAM DOUKAS DOB: Sep 29, 1955  REFERRING DOCTOR OR PCP:  Dr. Victorino Dike ([facility]); Dr. Otelia Santee (PCP) SOURCE: Patient, notes from orthopedics  _________________________________   HISTORICAL  CHIEF COMPLAINT:  No chief complaint on file.   HISTORY OF PRESENT ILLNESS:  Ann Myers is a 65 y.o. woman with a left foot drop who is now noting vertigo.  She had right [facility] March 2021.  Around July 2021 she noted progressive weakness with ankle extension causing a foot drop in the left leg.  She did not note numbness.  She had noted some pain near the fibular head.    She has no symptoms on the right.  She does not recall any injury near the fibular head.   She did note some intensification of her knee pain around the time the drop foot started.  For several months, the weakness was unchanged and she continued to have a foot drop.  However, starting around mid December she noted improvement and she feels she is better this week than last week.  She still notes some difficulty with gait especially when she is moving fast.  Around the time that the foot began to improve she also noted vertigo.  This is low-level and constant with intensifications noted when she moves.  She had not noted any change in her hearing.  She does not have a stuffy sensation in the ears.  NCV/EMG study 05/31/2020 shows a moderate peroneal neuropathy at or  near the fibular head on the left.  There was no evidence of acute denervation.  REVIEW OF SYSTEMS: Constitutional: No fevers, chills, sweats, or change in appetite Eyes: No visual changes, double vision, eye pain Ear, nose and throat: No hearing loss, ear pain, nasal congestion, sore throat Cardiovascular: No chest pain, palpitations Respiratory: No shortness of breath at rest or with exertion.   No wheezes GastrointestinaI: No nausea, vomiting, diarrhea,  abdominal pain, fecal incontinence Genitourinary: No dysuria, urinary retention or frequency.  No nocturia. Musculoskeletal: No neck pain, back pain Integumentary: No rash, pruritus, skin lesions Neurological: as above Psychiatric: No depression at this time.  No anxiety Endocrine: No palpitations, diaphoresis, change in appetite, change in weigh or increased thirst Hematologic/Lymphatic: No anemia, purpura, petechiae. Allergic/Immunologic: No itchy/runny eyes, nasal congestion, recent allergic reactions, rashes  ALLERGIES: Allergies  Allergen Reactions  . Benactyzine Hives  . Other Hives  . Povidone Iodine Hives  . Short Ragweed Pollen Ext Hives  . Adhesive [Tape] Other (See Comments)    Steri-strips/Welp  . Latex Other (See Comments)    Dry Blister    HOME MEDICATIONS:  Current Outpatient Medications:  .  atorvastatin (LIPITOR) 10 MG tablet, Take 1 tablet by mouth daily., Disp: , Rfl:  .  dicyclomine (BENTYL) 10 MG capsule, Take 10 mg by mouth 3 (three) times daily as needed for diarrhea or loose stools., Disp: , Rfl:  .  DULoxetine (CYMBALTA) 60 MG capsule, Take 60 mg by mouth daily., Disp: , Rfl:  .  INVOKANA 100 MG TABS tablet, Take 100 mg by mouth daily., Disp: , Rfl:  .  levothyroxine (SYNTHROID) 88 MCG tablet, Take 88 mcg by mouth daily., Disp: , Rfl:  .  losartan-hydrochlorothiazide (HYZAAR) 50-12.5 MG tablet, Take 1 tablet by mouth daily., Disp: , Rfl:  .  OZEMPIC, 0.25 OR 0.5 MG/DOSE, 2 MG/1.5ML SOPN, Inject 0.5 mg into the skin once a week. Friday, Disp: , Rfl:  .  tolterodine (DETROL LA) 4 MG 24 hr capsule, Take 4 mg by mouth daily., Disp: , Rfl:   PAST MEDICAL HISTORY: Past Medical History:  Diagnosis Date  . Anxiety   . Arthritis   . Complication of anesthesia   . Depression   . Diabetes mellitus without complication (HCC)   . Hypertension   . Hypothyroidism   . PONV (postoperative nausea and vomiting)     PAST SURGICAL HISTORY: Past Surgical  History:  Procedure Laterality Date  . CESAREAN SECTION  1988  . CHOLECYSTECTOMY  2015  . DILATION AND CURETTAGE OF UTERUS  2020   Dr. Dareen Piano at [facility]  and at 65 years old-for irregular bleeding  . EYE SURGERY     brow lift surgery  . ganglion cyst of foot  01/2019   left foot  . HERNIA REPAIR  2014   incisional hernia repair-and did tummy tuck at that time  . LAPAROSCOPIC GASTRIC BANDING  2009  . left shoulder surgery     shaving of left shoulder-arthritis  . TOTAL HIP ARTHROPLASTY Right 07/28/2019   Procedure: TOTAL HIP ARTHROPLASTY ANTERIOR APPROACH;  Surgeon: Durene Romans, MD;  Location: [facility];  Service: Orthopedics;  Laterality: Right;  70 mins    FAMILY HISTORY: No family history on file.  SOCIAL HISTORY:  Social History   Socioeconomic History  . Marital status: Widowed    Spouse name: Not on file  . Number of children: 2  . Years of education: Not on file  . Highest education level:  Not on file  Occupational History  . Not on file  Tobacco Use  . Smoking status: Never Smoker  . Smokeless tobacco: Never Used  Vaping Use  . Vaping Use: Never used  Substance and Sexual Activity  . Alcohol use: Yes    Comment: occassionally  . Drug use: Yes    Frequency: 7.0 times per week    Types: Marijuana    Comment: smoked last night 07/20/2019 at interview 07/21/2019  . Sexual activity: Not on file  Other Topics Concern  . Not on file  Social History Narrative  . Not on file   Social Determinants of Health   Financial Resource Strain: Not on file  Food Insecurity: Not on file  Transportation Needs: Not on file  Physical Activity: Not on file  Stress: Not on file  Social Connections: Not on file  Intimate Partner Violence: Not on file     PHYSICAL EXAM  There were no vitals filed for this visit.  There is no height or weight on file to calculate BMI.   General: The patient is well-developed and well-nourished and in no acute distress  HEENT:   Head is [place]/AT.  Sclera are anicteric.  Tympanic membranes were intact.  The ear canals were normal.  Skin: Extremities are without rash or edema.  Neurologic Exam  Mental status: The patient is alert and oriented x 3 at the time of the examination. The patient has apparent normal recent and remote memory, with an apparently normal attention span and concentration ability.   Speech is normal.  Cranial nerves: Extraocular movements are full.   Facial symmetry is present. There is good facial sensation to soft touch bilaterally.Facial strength is normal.  Trapezius and sternocleidomastoid strength is normal. No dysarthria is noted.  Hearing was reduced on the right and the Weber lateralized to the left.  Motor:  Muscle bulk is normal.   Tone is normal. Strength is 4-5 in the left toe extensors and 4-/5 in the left ankle extensors and 5/5 elsewhere in the left leg and in the right leg and both arms.  This represents improvement.  Sensory: Sensory testing is intact to pinprick, soft touch and vibration sensation in all 4 extremities except reduced sensation in the dorsum of the left foot between the first and second toes.  Sensation elsewhere was normal.  Gait and station: Station is normal.   Gait shows improved left foot drop.  Reflexes: Deep tendon reflexes are symmetric and normal bilaterally.         DIAGNOSTIC DATA (LABS, IMAGING, TESTING) - I reviewed patient records, labs, notes, testing and imaging myself where available.  Lab Results  Component Value Date   WBC 10.0 07/29/2019   HGB 13.8 07/29/2019   HCT 42.1 07/29/2019   MCV 94.0 07/29/2019   PLT 243 07/29/2019      Component Value Date/Time   NA 138 07/29/2019 0317   K 4.0 07/29/2019 0317   CL 104 07/29/2019 0317   CO2 24 07/29/2019 0317   GLUCOSE 145 (H) 07/29/2019 0317   BUN 13 07/29/2019 0317   CREATININE 0.58 07/29/2019 0317   CALCIUM 8.7 (L) 07/29/2019 0317   GFRNONAA >60 07/29/2019 0317   GFRAA >60 07/29/2019  0317   No results found for: CHOL, HDL, LDLCALC, LDLDIRECT, TRIG, CHOLHDL Lab Results  Component Value Date   HGBA1C 6.2 (H) 07/24/2019       ASSESSMENT AND PLAN  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 She has a peroneal neuropathy.  This is improving.  Hopefully this will reach maximum improvement within another 6 to 8 weeks. 2.  Etiology of the vertigo and mild hearing loss is unclear.  Since symptoms have only been present for couple weeks, I will hold off on further evaluation.  However, she is advised to give us a call in 3 to 4 weeks if she is not any better or if she is worsening.  If that is occurring we will need to check an MRI to assess for the possibility of an acoustic schwannoma or other process. 3.   Return as needed or call if symptoms worsen.   Mohamed Portlock A. Epimenio FootSater, MD, [facility],FAAN 05/31/2020, 9:03 AM Certified in Neurology, Clinical Neurophysiology, Sleep Medicine and Neuroimaging  [facility] [address], [address] [place], [place] [id number] [phone]

## 2021-04-12 ENCOUNTER — Telehealth: Payer: Self-pay | Admitting: Neurology

## 2021-04-12 NOTE — Telephone Encounter (Signed)
done

## 2021-04-12 NOTE — Telephone Encounter (Signed)
Pt requesting medical records for Nerve Conduction test done on 05/1120. Would like a call back

## 2021-07-26 IMAGING — DX DG PORTABLE PELVIS
1 series · 1 of 1 positions shown · non-contrast
Comparison: Fluoroscopic images of same day.

CLINICAL DATA: Status post right total hip replacement.

EXAM:
PORTABLE PELVIS 1-2 VIEWS

[pelvis ap]
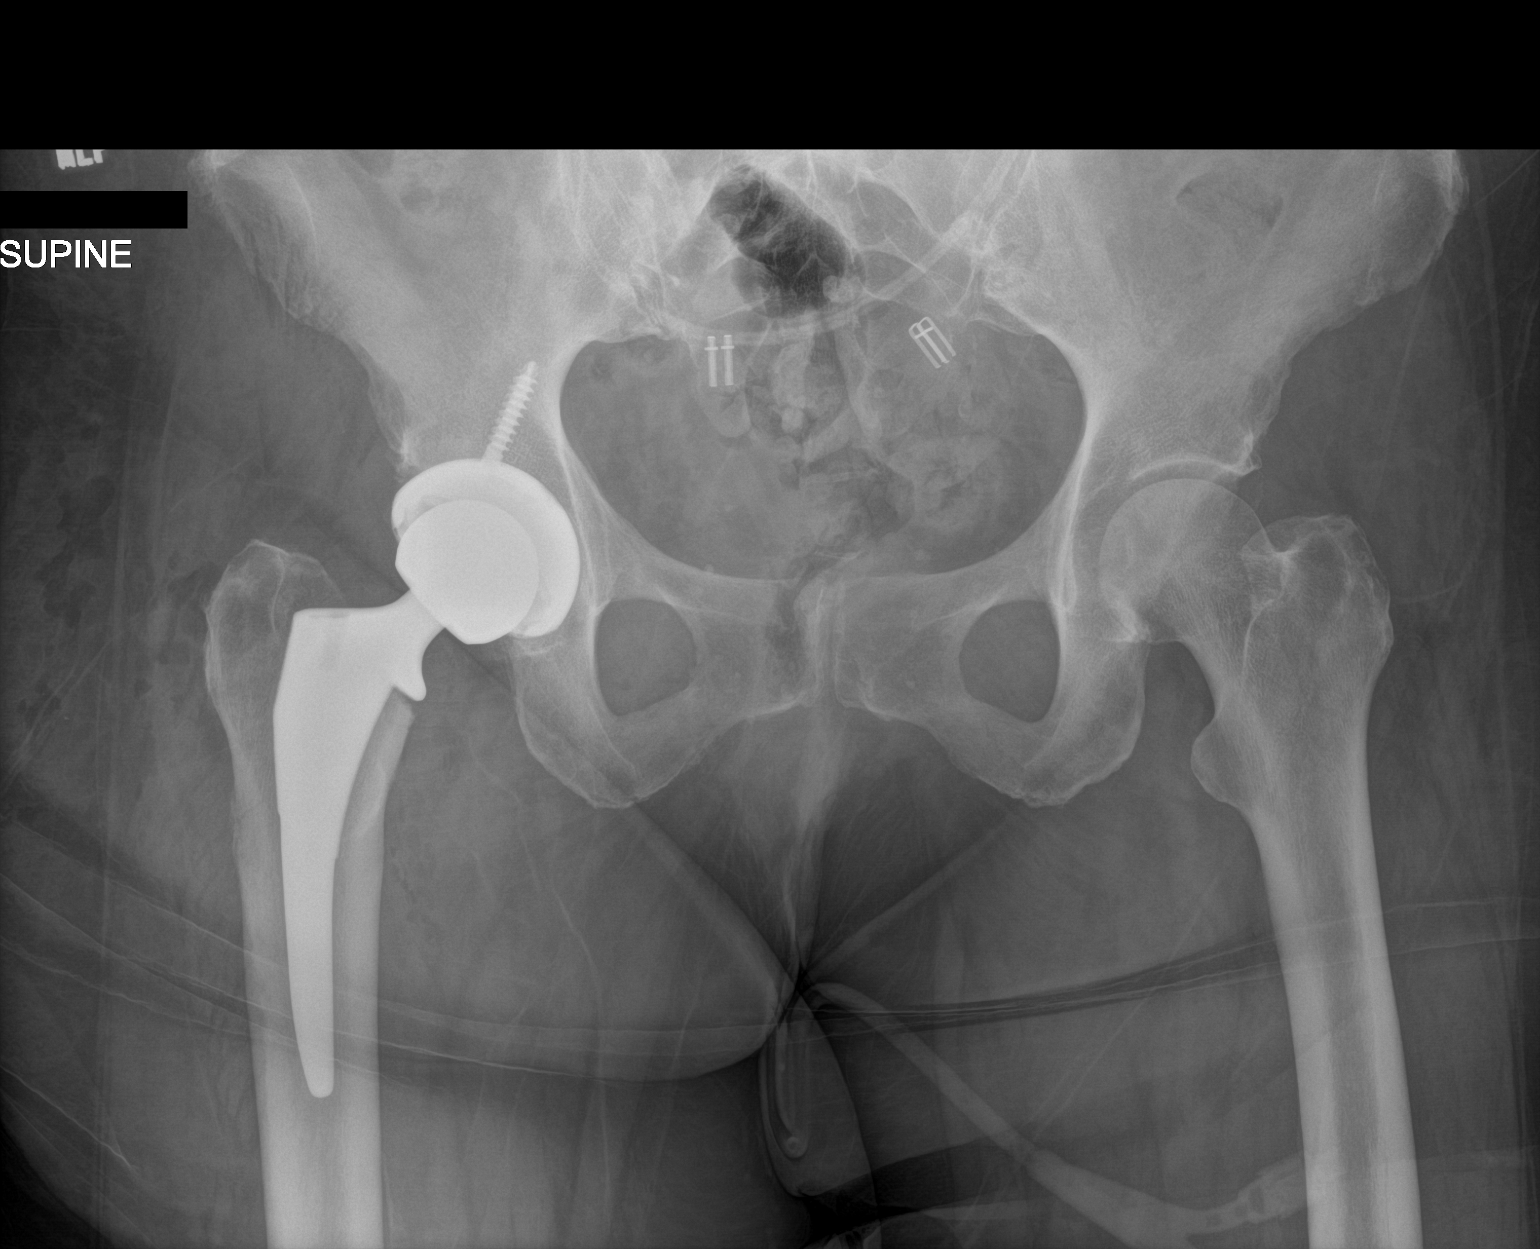

[1 of 1 positions shown; findings below may reference images not displayed]

FINDINGS: The right femoral and acetabular components appear to be well
situated. Expected postoperative changes are seen in the surrounding
soft tissues.
IMPRESSION: Status post right total hip arthroplasty.

## 2021-07-26 IMAGING — RF DG HIP (WITH PELVIS) OPERATIVE*R*
1 series · 8 of 8 positions shown · non-contrast
Comparison: None.

CLINICAL DATA: Status post right hip replacement.

FLUOROSCOPY TIME:  7 seconds
EXAM:
OPERATIVE RIGHT HIP (WITH PELVIS IF PERFORMED)  VIEWS
TECHNIQUE: Fluoroscopic spot image(s) were submitted for interpretation
post-operatively.

[Series 1: unknown protocol · 0.20mm/px · 8 of 8 slices shown]
[im 1/8]
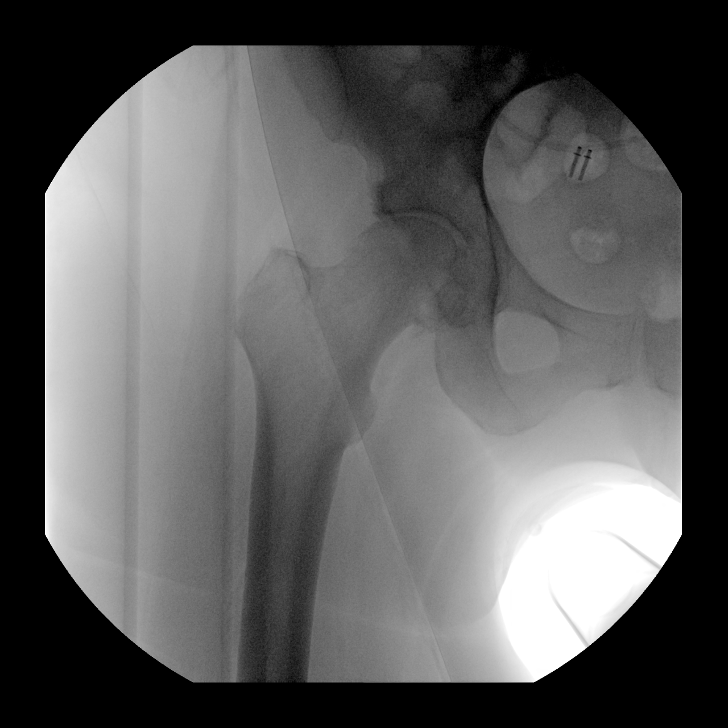
[im 2/8]
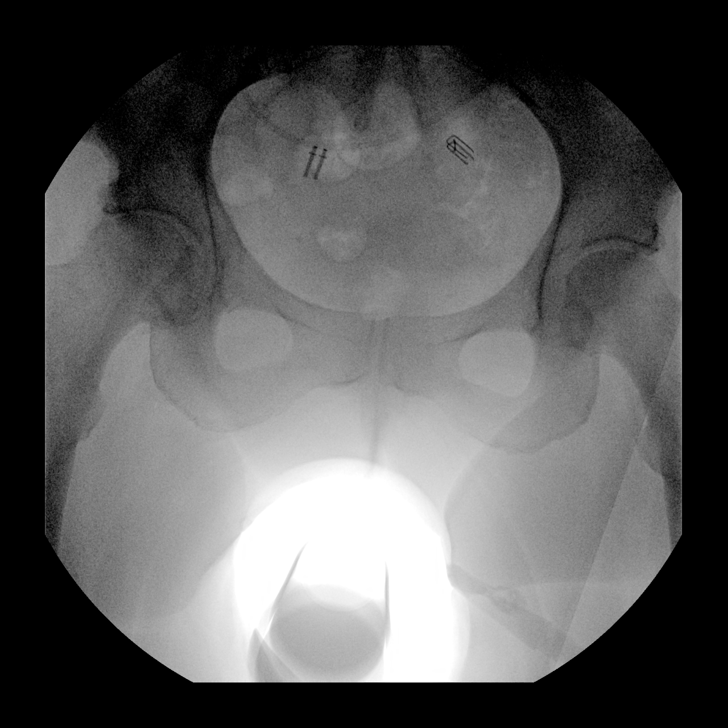
[im 3/8]
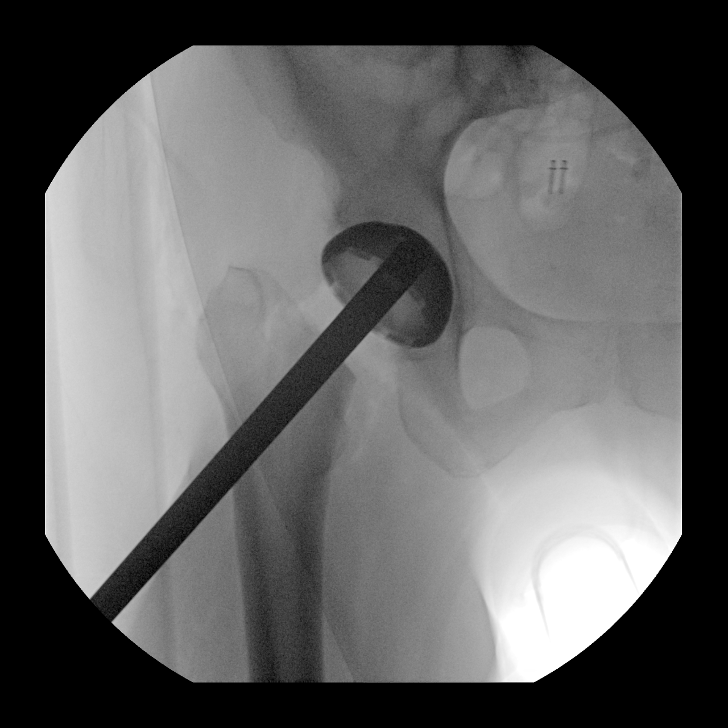
[im 4/8]
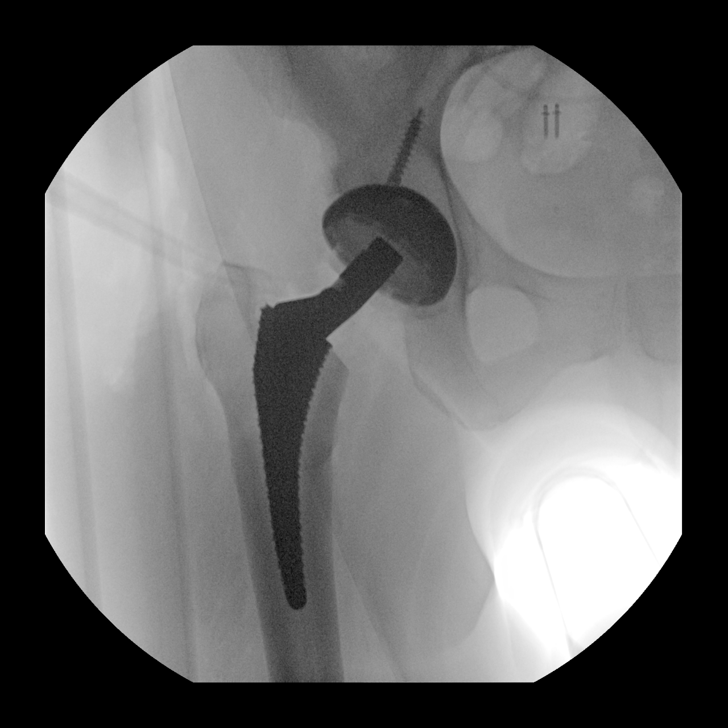
[im 5/8]
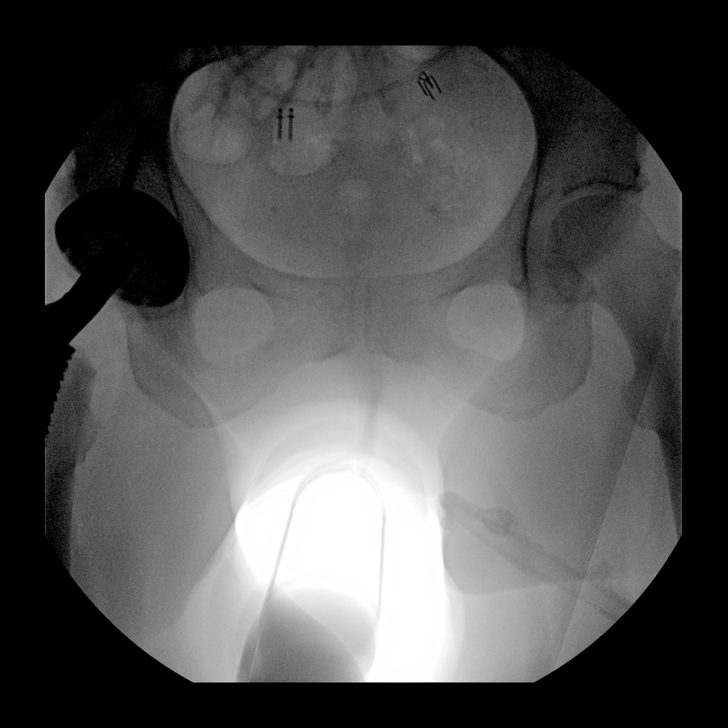
[im 6/8]
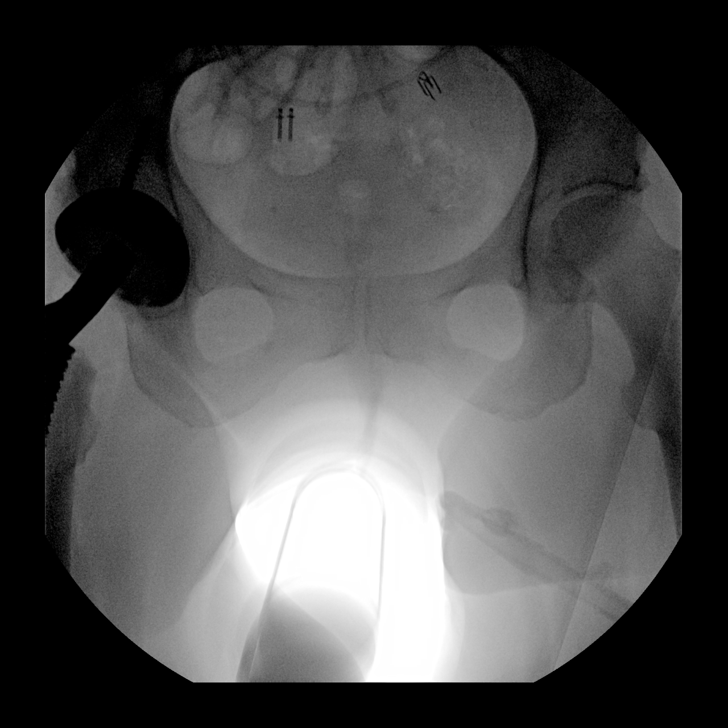
[im 7/8]
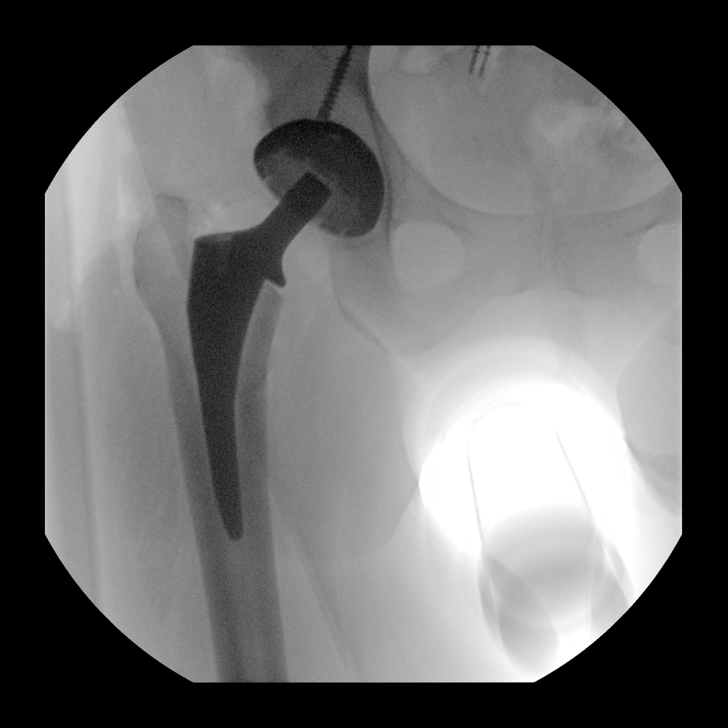
[im 8/8]
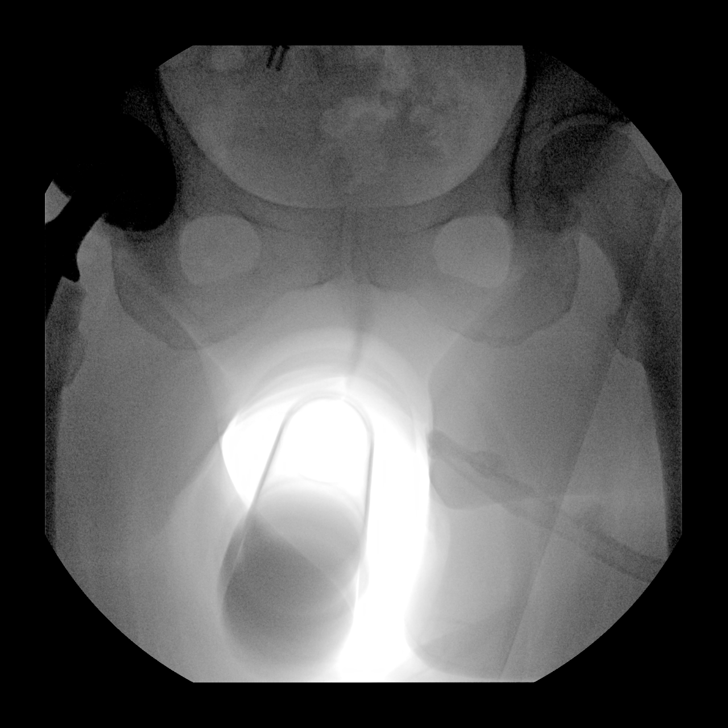

[8 of 8 positions shown; findings below may reference images not displayed]

FINDINGS: The patient's right hip is replaced during the study. Acetabular and
femoral components are in good position on AP views.
IMPRESSION: Right hip replacement as above.
